# Patient Record
Sex: Female | Born: 1990 | Race: White | Hispanic: Yes | Marital: Single | Smoking: Current every day smoker
Health system: Southern US, Community
[De-identification: ages and names within clinical notes are randomized; demographics above are authoritative.]

---
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
## 2010-01-11 ENCOUNTER — Ambulatory Visit: Payer: Self-pay | Admitting: Diagnostic Radiology

## 2010-01-11 ENCOUNTER — Emergency Department (HOSPITAL_BASED_OUTPATIENT_CLINIC_OR_DEPARTMENT_OTHER): Admission: EM | Admit: 2010-01-11 | Discharge: 2010-01-11 | Payer: Self-pay | Admitting: Emergency Medicine

## 2010-02-21 ENCOUNTER — Inpatient Hospital Stay (HOSPITAL_COMMUNITY): Admission: AD | Admit: 2010-02-21 | Discharge: 2010-02-21 | Payer: Self-pay | Admitting: Obstetrics and Gynecology

## 2010-02-24 ENCOUNTER — Ambulatory Visit (HOSPITAL_COMMUNITY): Admission: RE | Admit: 2010-02-24 | Discharge: 2010-02-24 | Payer: Self-pay | Admitting: Obstetrics & Gynecology

## 2010-04-07 ENCOUNTER — Ambulatory Visit: Payer: Self-pay | Admitting: Family Medicine

## 2010-04-10 ENCOUNTER — Inpatient Hospital Stay (HOSPITAL_COMMUNITY): Admission: AD | Admit: 2010-04-10 | Discharge: 2010-04-10 | Payer: Self-pay | Admitting: Obstetrics & Gynecology

## 2010-04-10 ENCOUNTER — Ambulatory Visit: Payer: Self-pay | Admitting: Obstetrics and Gynecology

## 2010-04-10 ENCOUNTER — Inpatient Hospital Stay (HOSPITAL_COMMUNITY): Admission: AD | Admit: 2010-04-10 | Discharge: 2010-04-13 | Payer: Self-pay | Admitting: Obstetrics & Gynecology

## 2010-09-06 LAB — CBC
HCT: 32.7 % — ABNORMAL LOW (ref 36.0–46.0)
Hemoglobin: 11.1 g/dL — ABNORMAL LOW (ref 12.0–15.0)
MCH: 32 pg (ref 26.0–34.0)
MCV: 94.3 fL (ref 78.0–100.0)
RBC: 3.47 MIL/uL — ABNORMAL LOW (ref 3.87–5.11)
RDW: 14.8 % (ref 11.5–15.5)

## 2010-09-08 LAB — URINALYSIS, ROUTINE W REFLEX MICROSCOPIC
Glucose, UA: NEGATIVE mg/dL
Ketones, ur: NEGATIVE mg/dL
Protein, ur: NEGATIVE mg/dL

## 2010-09-08 LAB — URINE MICROSCOPIC-ADD ON: RBC / HPF: NONE SEEN RBC/hpf (ref <3)

## 2010-09-09 LAB — URINALYSIS, ROUTINE W REFLEX MICROSCOPIC
Bilirubin Urine: NEGATIVE
Ketones, ur: NEGATIVE mg/dL
Nitrite: NEGATIVE
Protein, ur: NEGATIVE mg/dL
Specific Gravity, Urine: 1.008 (ref 1.005–1.030)
pH: 7 (ref 5.0–8.0)

## 2010-09-09 LAB — WET PREP, GENITAL: Yeast Wet Prep HPF POC: NONE SEEN

## 2010-09-09 LAB — URINE MICROSCOPIC-ADD ON

## 2011-03-20 LAB — WOUND CULTURE: Gram Stain: NONE SEEN

## 2011-08-24 IMAGING — US US OB LIMITED
1 series · 14 of 28 positions shown · non-contrast
Comparison: none

CLINICAL DATA: Left lower quadrant pain.  Uncertain menstrual
dates.

[Series 1: us ob limited · 0.30mm/px · 14 of 28 slices shown]
[im 2/28]
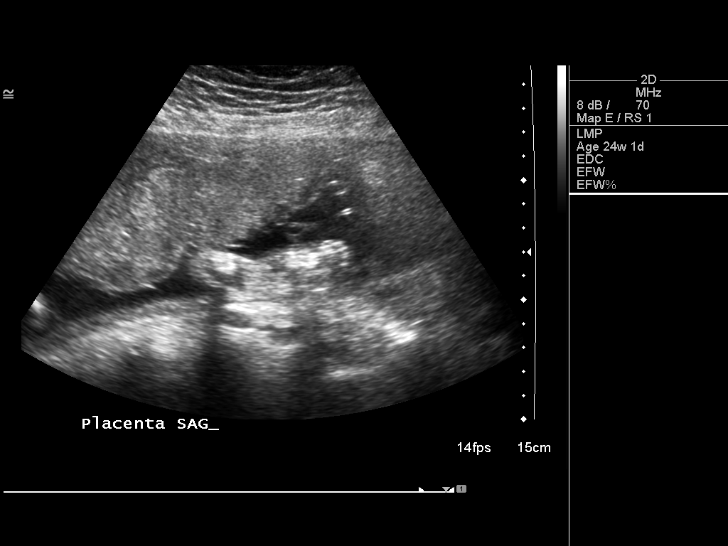
[im 4/28]
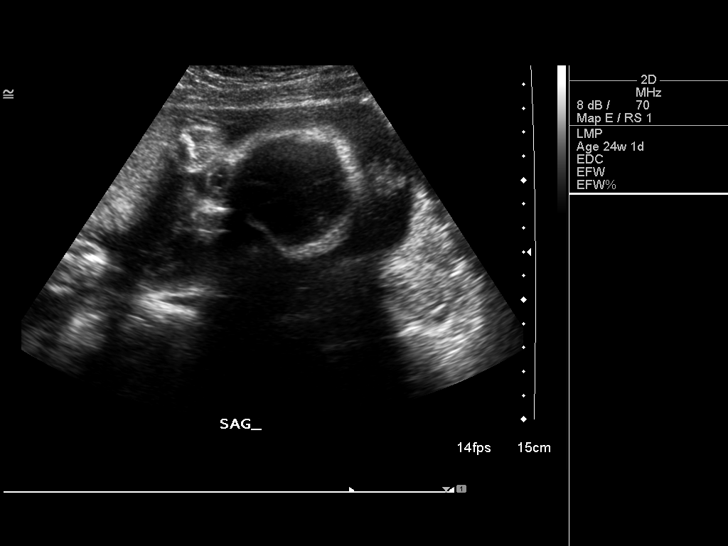
[im 6/28]
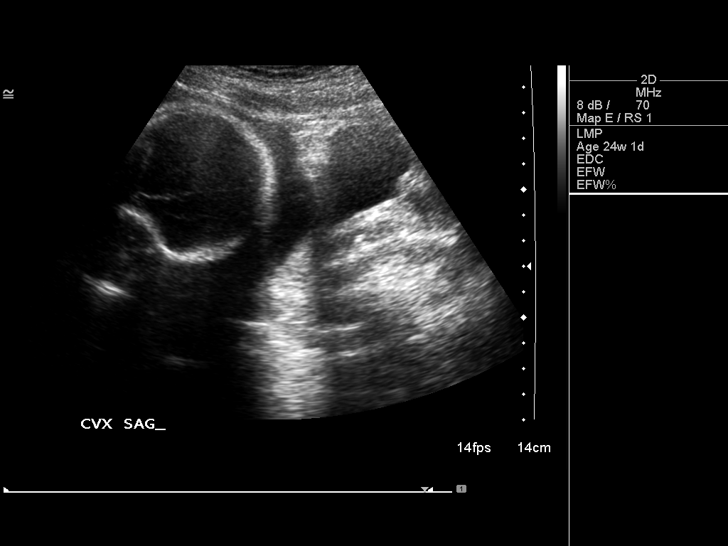
[im 8/28]
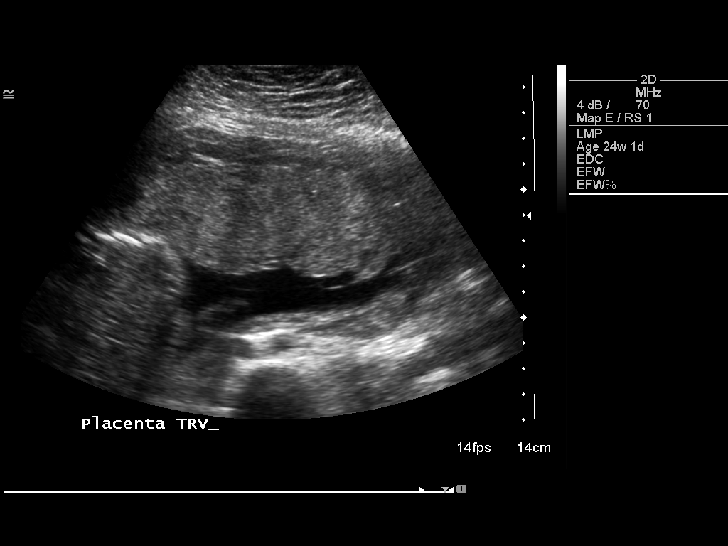
[im 10/28]
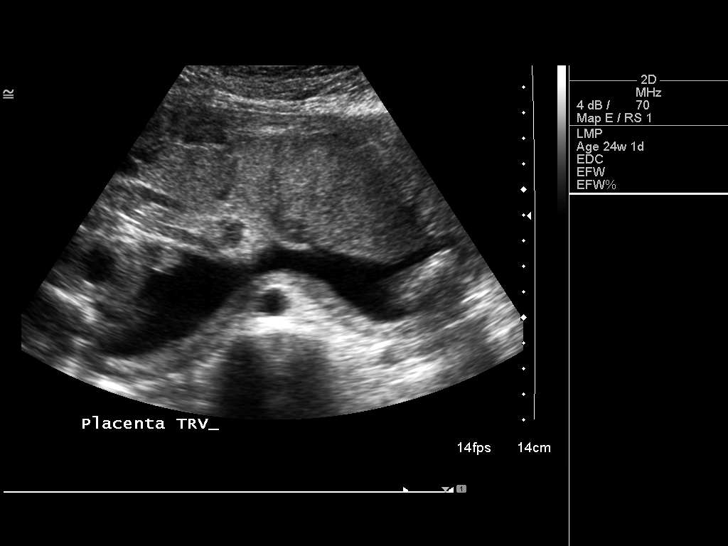
[im 12/28]
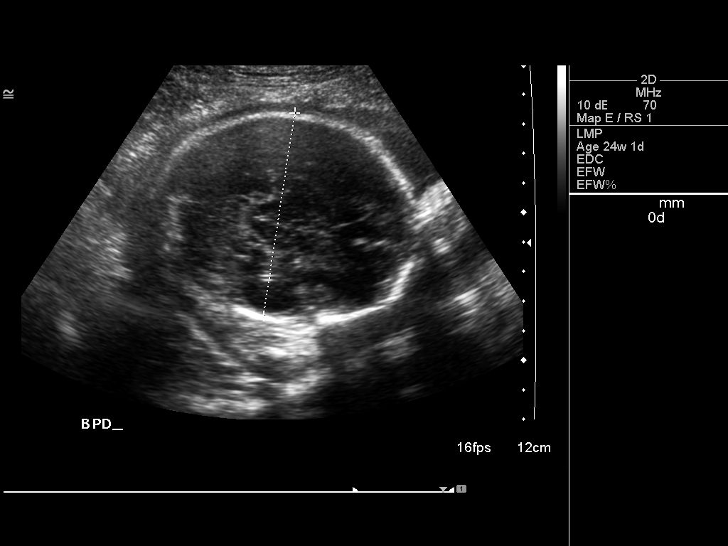
[im 14/28]
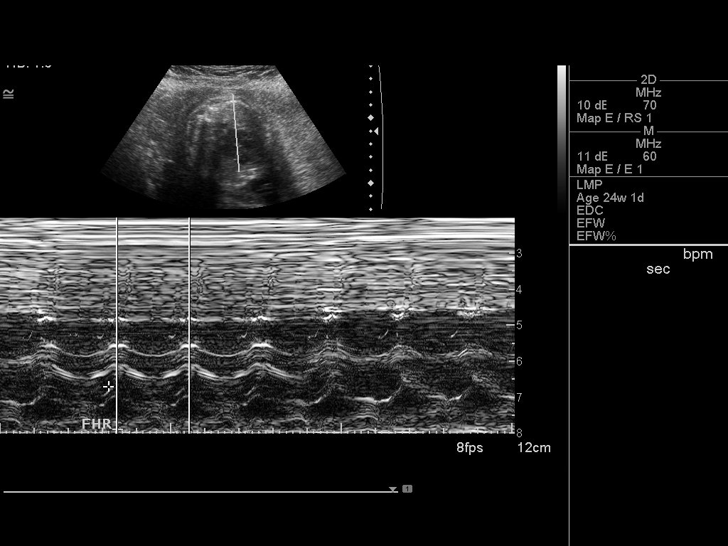
[im 16/28]
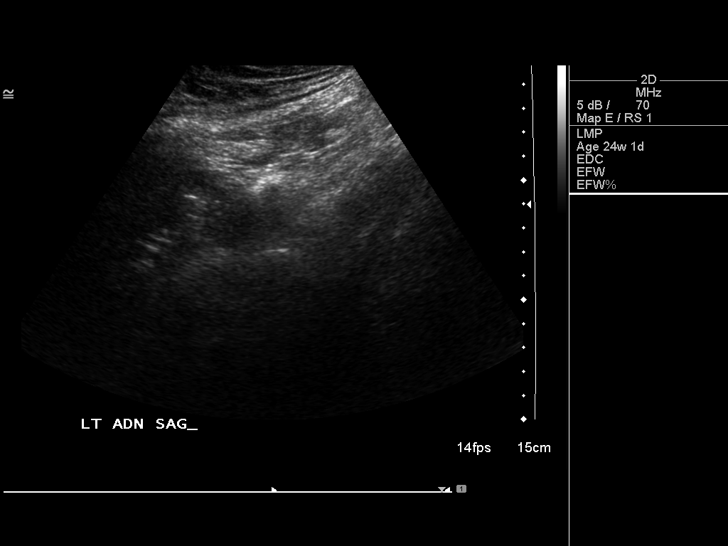
[im 18/28]
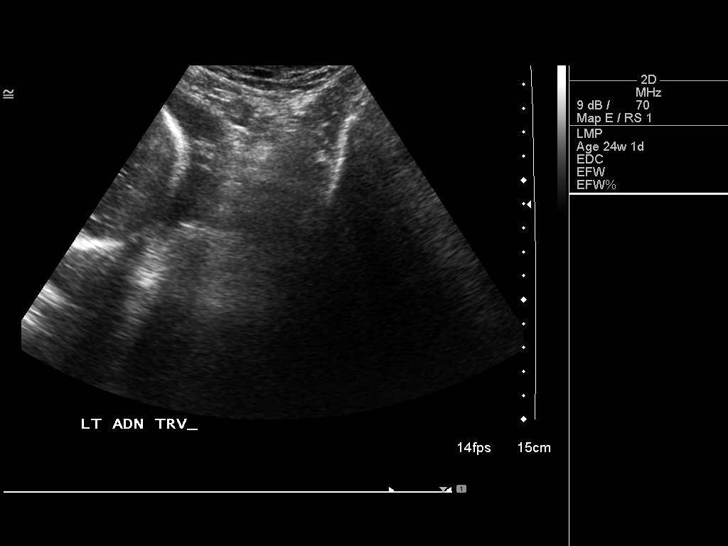
[im 20/28]
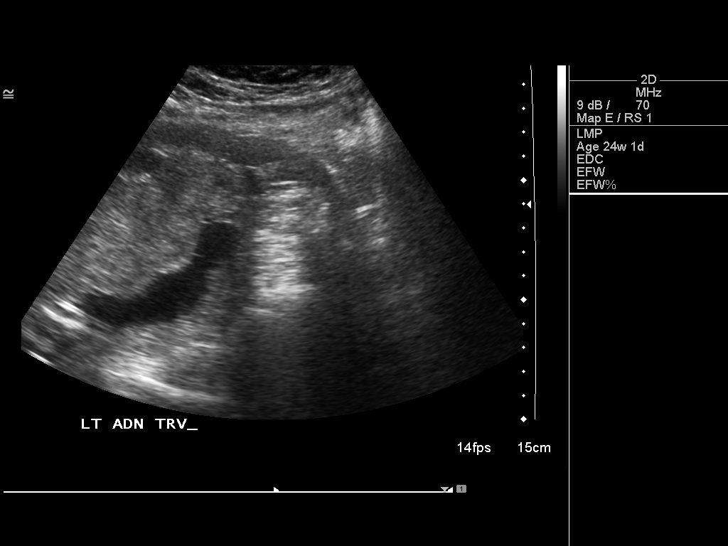
[im 22/28]
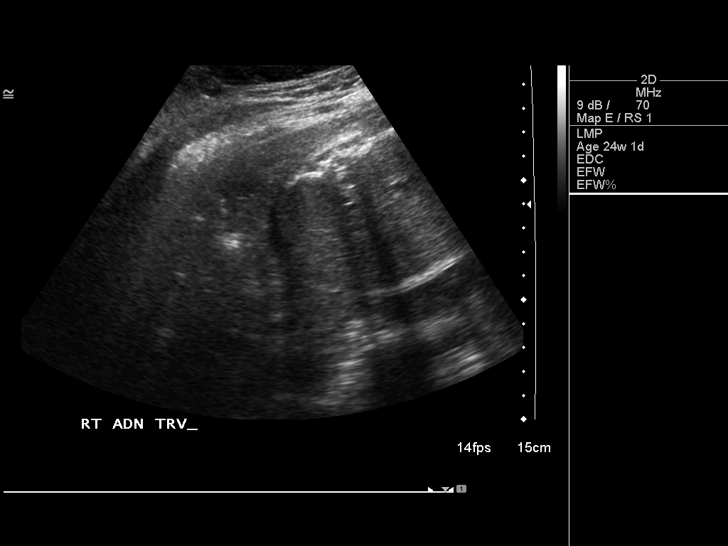
[im 24/28]
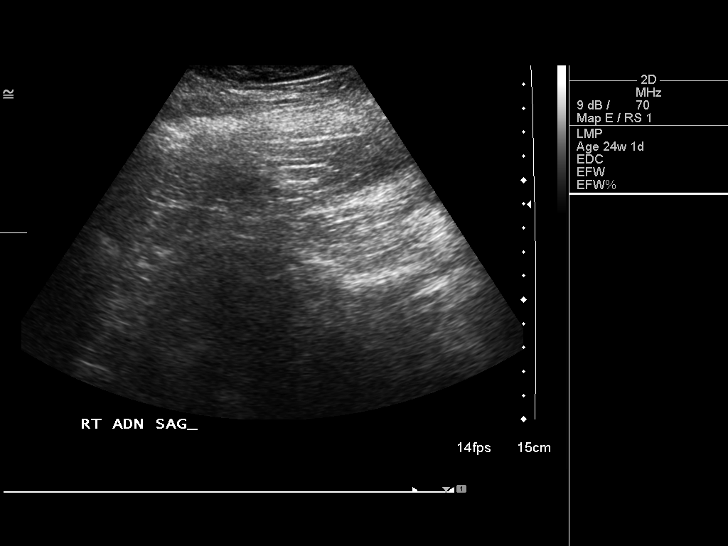
[im 26/28]
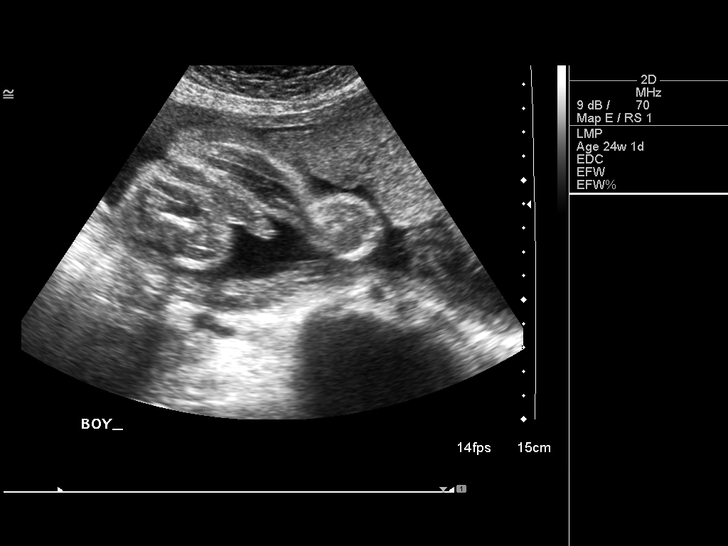
[im 28/28]
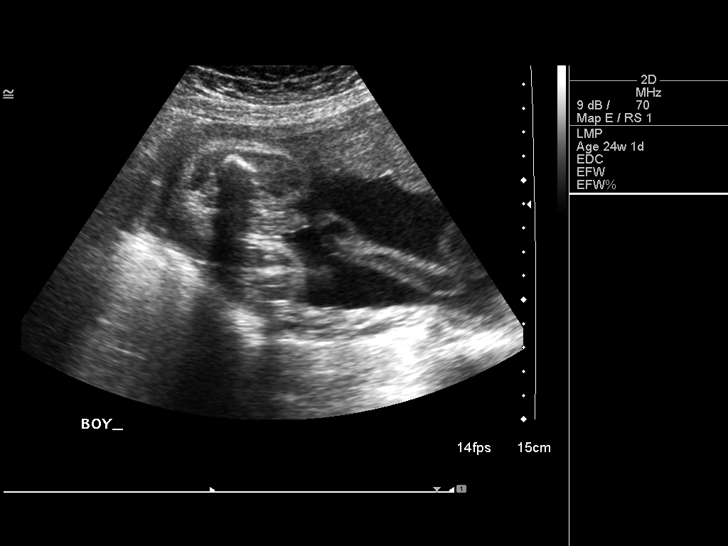

[14 of 28 positions shown; findings below may reference images not displayed]

LIMITED OBSTETRIC ULTRASOUND

Number of Fetuses: 1
Heart Rate: 909bpm
Movement: Visualized
Presentation: Cephalic
Placental Location: Anterior left lateral
Previa: No
Amniotic Fluid (Subjective): Within normal limits

BPD: 6.9cm   28w   0d

MATERNAL FINDINGS:
Cervix: Appears closed./
Uterus/Adnexae: No abnormality visualized
IMPRESSION: Single living intrauterine fetus in cephalic presentation at
approximately gestational age of 28 weeks.

Recommend non-emergent OB 14+ wk complete US examination for
complete fetal biometry and anatomic survey.  This could be
performed at the [REDACTED].

## 2011-10-07 IMAGING — US US OB COMP +14 WK
1 series · 14 of 28 positions shown · non-contrast
Comparison: none

OBSTETRICAL ULTRASOUND:
 This ultrasound exam was performed in the [HOSPITAL] Ultrasound Department.  The OB US report was generated in the AS system, and faxed to the ordering physician.  This report is also available in [HOSPITAL]?s [employer] and in [REDACTED] PACS.

[Series 1: us ob comp +14 wk · 0.21mm/px · 14 of 52 slices shown]
[im 2/52]
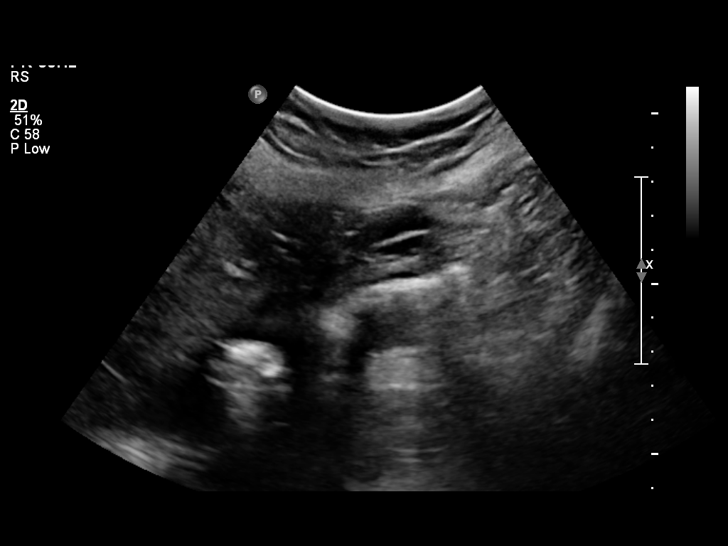
[im 6/52]
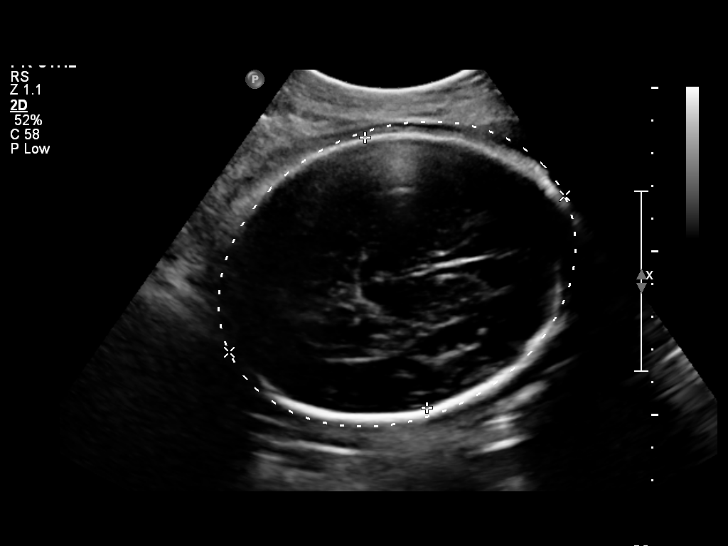
[im 10/52]
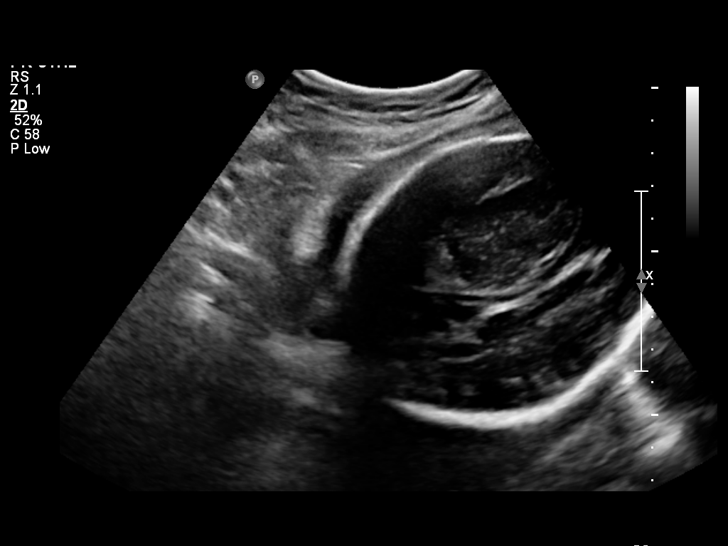
[im 14/52]
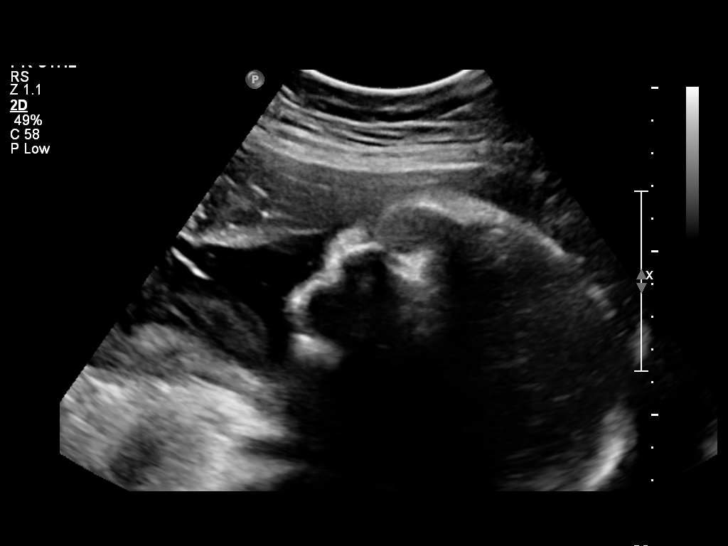
[im 18/52]
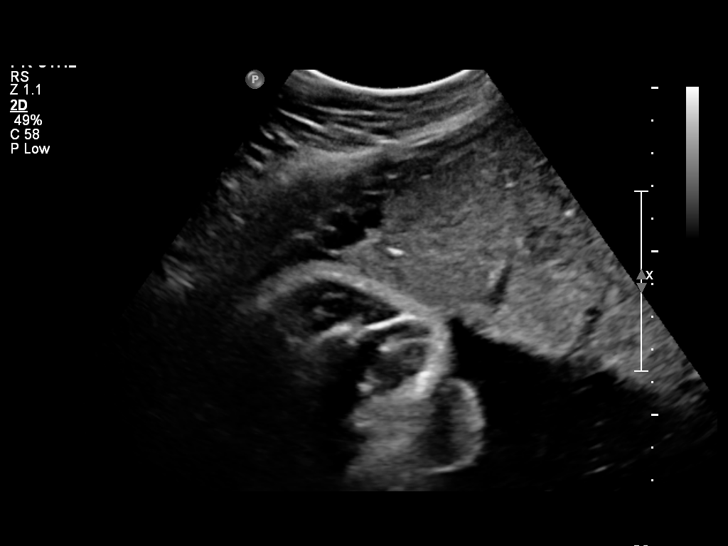
[im 21/52]
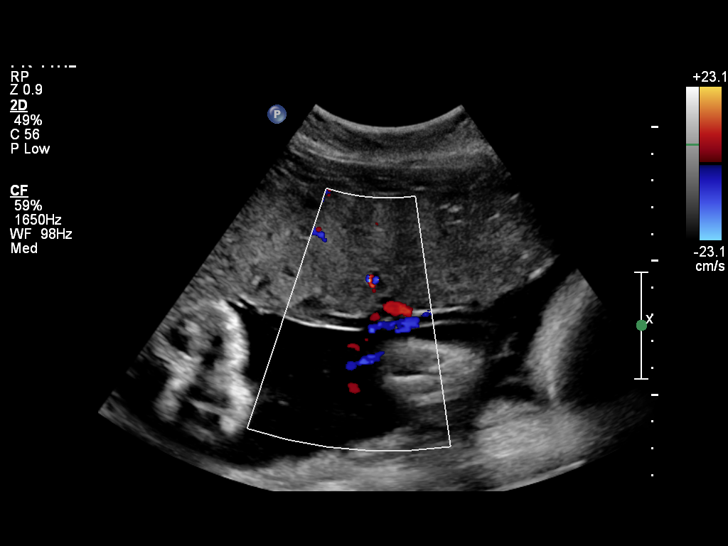
[im 25/52]
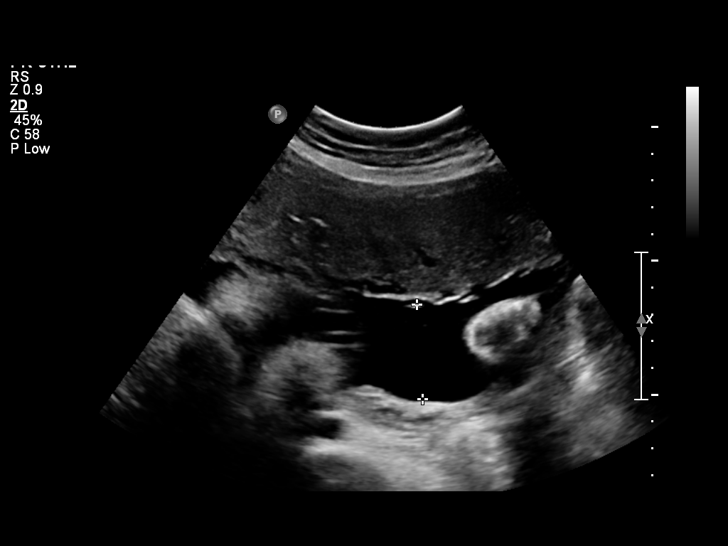
[im 29/52]
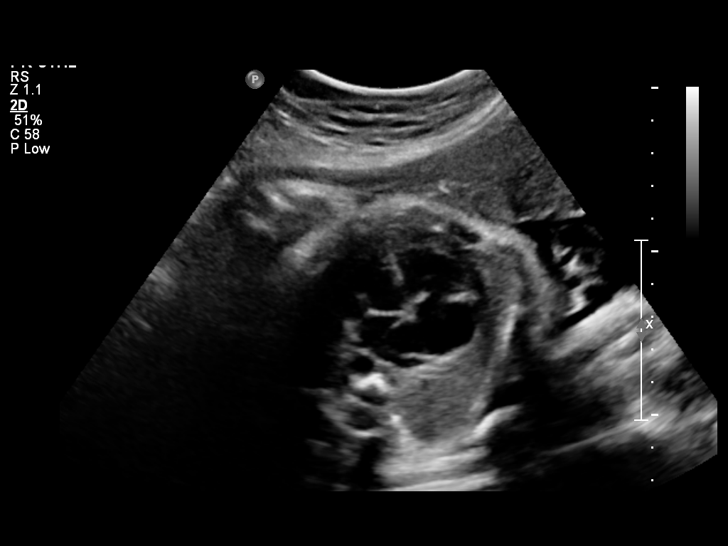
[im 33/52]
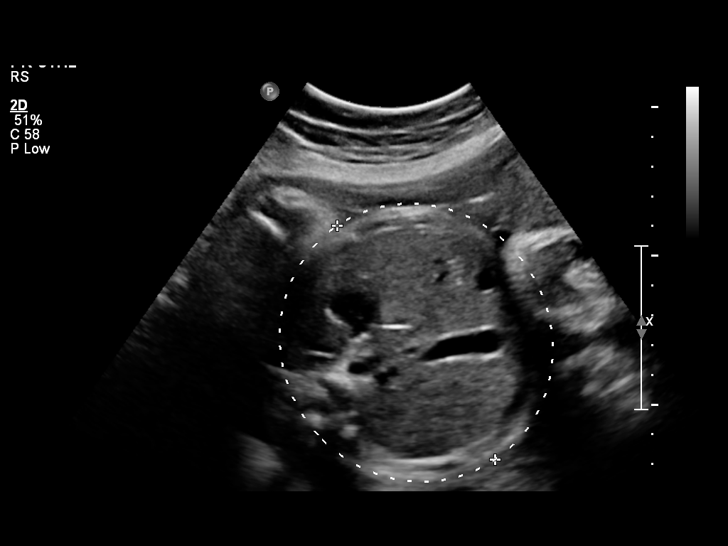
[im 36/52]
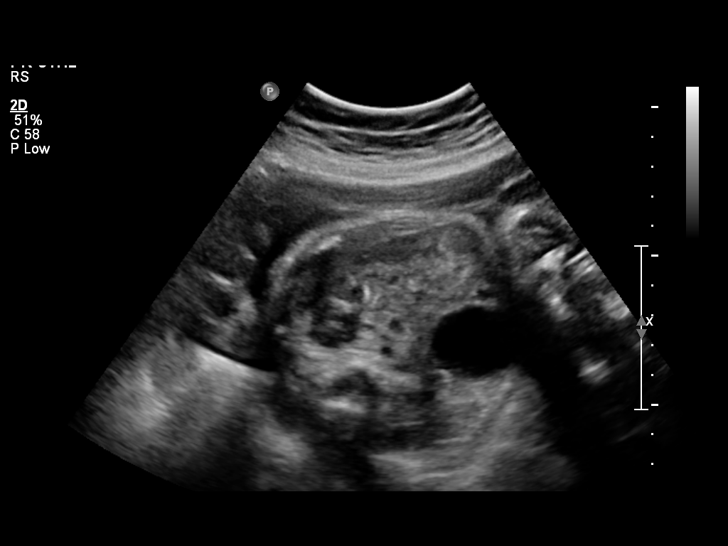
[im 40/52]
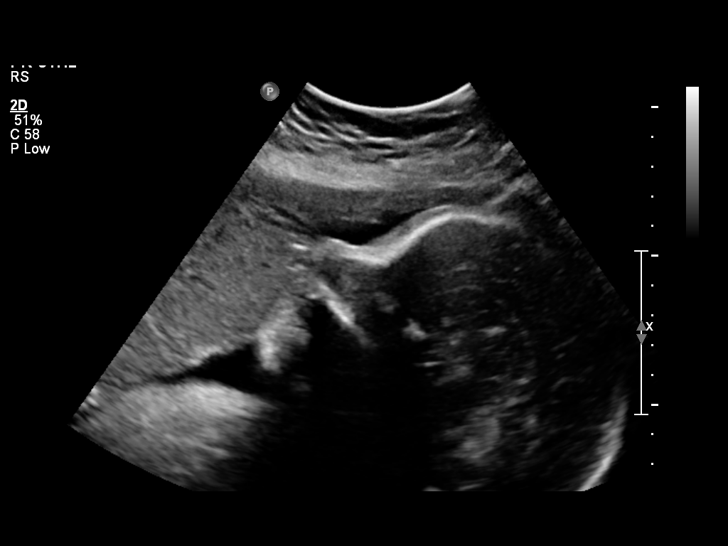
[im 44/52]
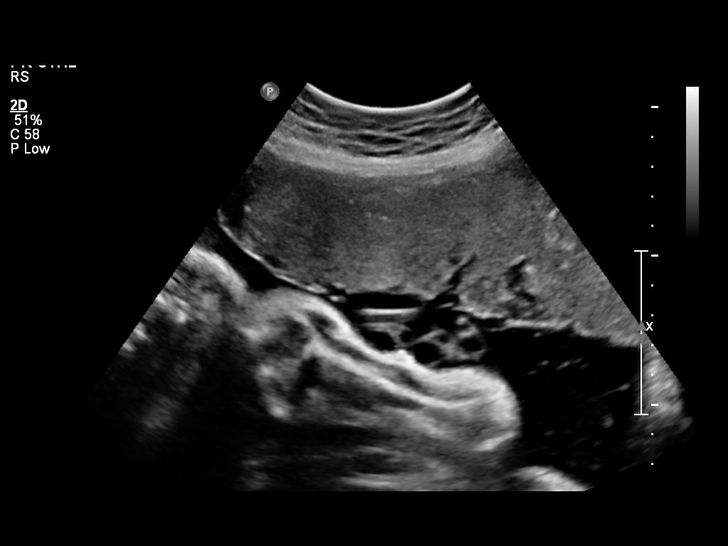
[im 48/52]
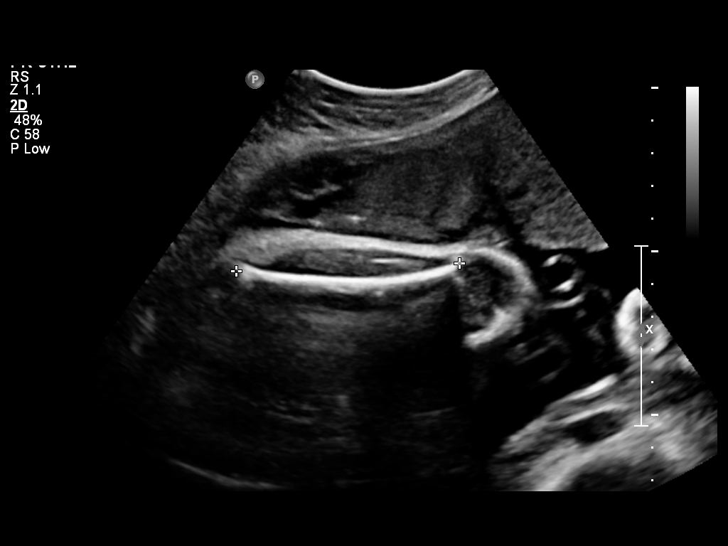
[im 52/52]
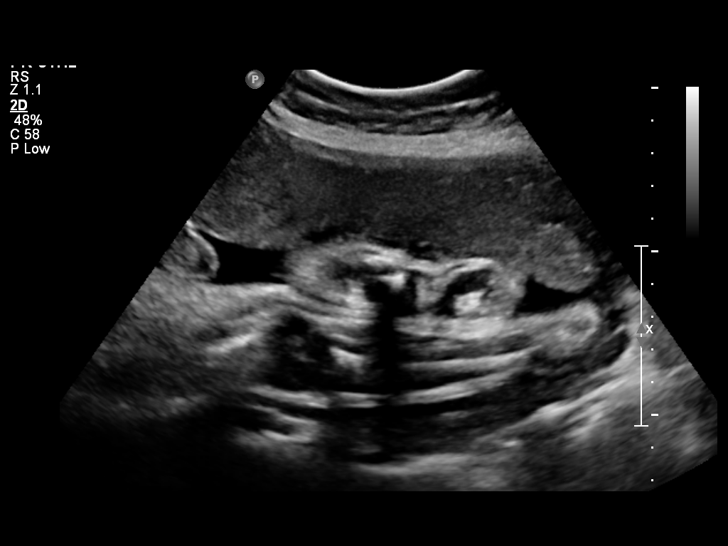

[14 of 28 positions shown; findings below may reference images not displayed]

IMPRESSION: See [facility] Obstetric US report.

## 2013-05-15 DIAGNOSIS — F32A Depression, unspecified: Secondary | ICD-10-CM | POA: Insufficient documentation

## 2013-05-15 DIAGNOSIS — F419 Anxiety disorder, unspecified: Secondary | ICD-10-CM | POA: Insufficient documentation

## 2013-05-15 DIAGNOSIS — E669 Obesity, unspecified: Secondary | ICD-10-CM | POA: Insufficient documentation

## 2013-05-15 DIAGNOSIS — E66811 Obesity, class 1: Secondary | ICD-10-CM | POA: Insufficient documentation

## 2013-05-15 DIAGNOSIS — M545 Low back pain, unspecified: Secondary | ICD-10-CM | POA: Insufficient documentation

## 2013-06-12 DIAGNOSIS — G43909 Migraine, unspecified, not intractable, without status migrainosus: Secondary | ICD-10-CM | POA: Insufficient documentation

## 2014-04-18 ENCOUNTER — Encounter (HOSPITAL_COMMUNITY): Payer: Self-pay | Admitting: Emergency Medicine

## 2014-04-18 ENCOUNTER — Emergency Department (HOSPITAL_COMMUNITY)
Admission: EM | Admit: 2014-04-18 | Discharge: 2014-04-18 | Disposition: A | Discharge status: Home / Self Care | Payer: Medicaid Other | Attending: Emergency Medicine | Admitting: Emergency Medicine

## 2014-04-18 DIAGNOSIS — R531 Weakness: Secondary | ICD-10-CM | POA: Insufficient documentation

## 2014-04-18 DIAGNOSIS — R202 Paresthesia of skin: Secondary | ICD-10-CM | POA: Diagnosis not present

## 2014-04-18 DIAGNOSIS — R479 Unspecified speech disturbances: Secondary | ICD-10-CM | POA: Insufficient documentation

## 2014-04-18 DIAGNOSIS — R200 Anesthesia of skin: Secondary | ICD-10-CM | POA: Insufficient documentation

## 2014-04-18 DIAGNOSIS — R251 Tremor, unspecified: Secondary | ICD-10-CM | POA: Insufficient documentation

## 2014-04-18 DIAGNOSIS — Z720 Tobacco use: Secondary | ICD-10-CM | POA: Diagnosis not present

## 2014-04-18 NOTE — ED Notes (Signed)
Pt c/o numbness to entire body starting with face upon waking and now all over body

## 2014-04-18 NOTE — ED Provider Notes (Signed)
CSN: [id number]     Arrival date & time 04/18/14  1418 History   First MD Initiated Contact with Patient 04/18/14 1509     Chief Complaint  Patient presents with  . Numbness     (Consider location/radiation/quality/duration/timing/severity/associated sxs/prior Treatment) HPI Comments: Pt comes in with cc of numbness. Pt reports that this AM when she woke up, she noticed right sided lip numbness and twitching. Those symptoms lasted for few minutes, but then the numbness spread to rest of her body, bilateral. Numbness is described as tingling. Pt also had some generalized weakness and shaking, and she had some trouble holding cups. Around 3 pm, symptos started improving, pt now has numbness to her face and bilateral pper extremity - forearm, left worse than right. No recent trauma, no tick exposures. No substance abuse.  The history is provided by the patient.    History reviewed. No pertinent past medical history. History reviewed. No pertinent past surgical history. History reviewed. No pertinent family history. History  Substance Use Topics  . Smoking status: Current Every Day Smoker  . Smokeless tobacco: Not on file  . Alcohol Use: Yes   OB History   Grav Para Term Preterm Abortions TAB SAB Ect Mult Living                 Review of Systems  Constitutional: Positive for activity change. Negative for fever, chills and diaphoresis.  Eyes: Negative for visual disturbance.  Respiratory: Negative for shortness of breath.   Cardiovascular: Negative for chest pain.  Gastrointestinal: Negative for nausea, vomiting and abdominal pain.  Genitourinary: Negative for dysuria.  Musculoskeletal: Negative for arthralgias, myalgias, neck pain and neck stiffness.  Skin: Negative for rash.  Allergic/Immunologic: Negative for immunocompromised state.  Neurological: Positive for tremors, facial asymmetry, speech difficulty, weakness and numbness. Negative for syncope, light-headedness and  headaches.  Hematological: Does not bruise/bleed easily.  All other systems reviewed and are negative.     Allergies  Review of patient's allergies indicates no known allergies.  Home Medications   Prior to Admission medications   Medication Sig Start Date End Date Taking? Authorizing Provider  acetaminophen (TYLENOL) 500 MG tablet Take 1,000 mg by mouth every 6 (six) hours as needed for headache.   Yes Historical Provider, MD   BP 100/54  Pulse 73  Temp(Src) 97.9 F (36.6 C) (Oral)  Resp 18  SpO2 100% Physical Exam  Nursing note and vitals reviewed. Constitutional: She is oriented to person, place, and time. She appears well-developed and well-nourished.  HENT:  Head: Normocephalic and atraumatic.  Eyes: EOM are normal. Pupils are equal, round, and reactive to light.  Neck: Neck supple.  Cardiovascular: Normal rate, regular rhythm and normal heart sounds.   No murmur heard. Pulmonary/Chest: Effort normal. No respiratory distress.  Abdominal: Soft. She exhibits no distension. There is no tenderness. There is no rebound and no guarding.  Neurological: She is alert and oriented to person, place, and time.  Subjective numbness - right face, bilateral forearms, left worse than right, able to discriminate between sharp and dull, upper and lower ext bilaterally 4+ and equal and 2+ patellar reflex.  Skin: Skin is warm and dry.    ED Course  Procedures (including critical care time) Labs Review Labs Reviewed - No data to display  Imaging Review No results found.   EKG Interpretation None      MDM   Final diagnoses:  Numbness and tingling    Pt comes in with cc of  numbness. Hx and exam are not suggestive of a central source, infection, or vascular source. No tick bites, no rash, no hx of similar sx and no fam hx of MS, or previous hx of similar sx. No risk factors for stroke. Sx appear to be resolving. RR is normal.  Pt's sx really dont support an emergent  condition. We will get chem 8. Otherwise, she has been advised to see Neurology if her sx persists, and return to the ER if the sx get worse.  UPDATE: Pt had eloped prior to proper discharge, and followup info was provided.   Derwood KaplanAnkit Rhyanna Sorce, MD 04/19/14 1045

## 2014-04-18 NOTE — ED Notes (Signed)
Upon entering room to draw blood, patient and visitor were not in the room and their belongings were missing as well.  Patient appears to have left without telling anyone.  Dr. Rhunette CroftNanavati notified.

## 2016-03-07 ENCOUNTER — Emergency Department (HOSPITAL_COMMUNITY)
Admission: EM | Admit: 2016-03-07 | Discharge: 2016-03-07 | Disposition: A | Discharge status: Home / Self Care | Payer: Medicaid Other | Attending: Emergency Medicine | Admitting: Emergency Medicine

## 2016-03-07 ENCOUNTER — Emergency Department (HOSPITAL_COMMUNITY): Payer: Medicaid Other

## 2016-03-07 ENCOUNTER — Encounter (HOSPITAL_COMMUNITY): Payer: Self-pay | Admitting: Emergency Medicine

## 2016-03-07 DIAGNOSIS — F17210 Nicotine dependence, cigarettes, uncomplicated: Secondary | ICD-10-CM | POA: Diagnosis not present

## 2016-03-07 DIAGNOSIS — R05 Cough: Secondary | ICD-10-CM

## 2016-03-07 DIAGNOSIS — R071 Chest pain on breathing: Secondary | ICD-10-CM | POA: Diagnosis not present

## 2016-03-07 DIAGNOSIS — R0602 Shortness of breath: Secondary | ICD-10-CM | POA: Diagnosis not present

## 2016-03-07 DIAGNOSIS — R059 Cough, unspecified: Secondary | ICD-10-CM

## 2016-03-07 DIAGNOSIS — J029 Acute pharyngitis, unspecified: Secondary | ICD-10-CM

## 2016-03-07 LAB — CBC WITH DIFFERENTIAL/PLATELET
BASOS ABS: 0 10*3/uL (ref 0.0–0.1)
Basophils Relative: 0 %
Eosinophils Absolute: 0.2 10*3/uL (ref 0.0–0.7)
Eosinophils Relative: 2 %
HEMATOCRIT: 41.9 % (ref 36.0–46.0)
HEMOGLOBIN: 13.6 g/dL (ref 12.0–15.0)
LYMPHS PCT: 14 %
Lymphs Abs: 1.6 10*3/uL (ref 0.7–4.0)
MCH: 30.2 pg (ref 26.0–34.0)
MCHC: 32.5 g/dL (ref 30.0–36.0)
MCV: 92.9 fL (ref 78.0–100.0)
MONO ABS: 0.5 10*3/uL (ref 0.1–1.0)
Monocytes Relative: 4 %
NEUTROS ABS: 8.7 10*3/uL — AB (ref 1.7–7.7)
Neutrophils Relative %: 80 %
Platelets: 248 10*3/uL (ref 150–400)
RBC: 4.51 MIL/uL (ref 3.87–5.11)
RDW: 14.9 % (ref 11.5–15.5)
WBC: 11 10*3/uL — AB (ref 4.0–10.5)

## 2016-03-07 LAB — I-STAT TROPONIN, ED: Troponin i, poc: 0 ng/mL (ref 0.00–0.08)

## 2016-03-07 LAB — BASIC METABOLIC PANEL
ANION GAP: 9 (ref 5–15)
BUN: 7 mg/dL (ref 6–20)
CALCIUM: 9.3 mg/dL (ref 8.9–10.3)
CHLORIDE: 107 mmol/L (ref 101–111)
CO2: 24 mmol/L (ref 22–32)
Creatinine, Ser: 0.6 mg/dL (ref 0.44–1.00)
GFR calc non Af Amer: >60 mL/min (ref >60)
Glucose, Bld: 101 mg/dL — ABNORMAL HIGH (ref 65–99)
Potassium: 3.9 mmol/L (ref 3.5–5.1)
Sodium: 140 mmol/L (ref 135–145)

## 2016-03-07 LAB — POC URINE PREG, ED: Preg Test, Ur: NEGATIVE

## 2016-03-07 LAB — D-DIMER, QUANTITATIVE (NOT AT ARMC): D DIMER QUANT: 0.32 ug{FEU}/mL (ref 0.00–0.50)

## 2016-03-07 MED ORDER — BENZONATATE 100 MG PO CAPS: 100.0000 mg | ORAL_CAPSULE | Freq: Three times a day (TID) | ORAL | 0 refills | Status: AC | PRN

## 2016-03-07 MED ORDER — IPRATROPIUM-ALBUTEROL 0.5-2.5 (3) MG/3ML IN SOLN
3.0000 mL | Freq: Once | RESPIRATORY_TRACT | Status: AC
  Administered 2016-03-07: 3 mL via RESPIRATORY_TRACT
  Filled 2016-03-07: qty 3

## 2016-03-07 NOTE — ED Notes (Signed)
Dr. Little MD at bedside. 

## 2016-03-07 NOTE — ED Triage Notes (Signed)
Pt arrives from home reporting productive cough starting yesterday, with new SOB and CP starting during the night. Pt denies fever, chills, N/V.  Pt reports being current 1 pack/day smoker.  Resp e/u.

## 2016-03-07 NOTE — ED Provider Notes (Signed)
[facility]-EMERGENCY DEPT Provider Note   CSN: [id number] Arrival date & time: 03/07/16  0840     History   Chief Complaint Chief Complaint  Patient presents with  . Cough  . Shortness of Breath  . Chest Pain    HPI Allison Cisneros is a 25 y.o. female.  25 year old female who presents with cough, chest pain, and shortness of breath. The patient states that yesterday she began having a cough productive of mucus. The cough has worsened since it began and last night she began having chest pain while coughing. Overnight she has noticed some shortness of breath. The shortness of breath is worse with coughing and she thinks may be worse laying flat. She has felt hot a few times but denies any fevers or chills. No vomiting or diarrhea. She does report associated sore throat and runny nose. No sick contacts or recent travel. No leg swelling, history of blood clots, or OCP use. She does smoke 1 pack per day. No history of asthma.   The history is provided by the patient.  Cough  Associated symptoms include chest pain and shortness of breath.  Shortness of Breath  Associated symptoms include cough and chest pain.  Chest Pain   Associated symptoms include cough and shortness of breath.    History reviewed. No pertinent past medical history.  There are no active problems to display for this patient.   History reviewed. No pertinent surgical history.  OB History    No data available       Home Medications    Prior to Admission medications   Medication Sig Start Date End Date Taking? Authorizing Provider  benzonatate (TESSALON) 100 MG capsule Take 1 capsule (100 mg total) by mouth 3 (three) times daily as needed for cough. 03/07/16   Laurence Spates, MD    Family History No family history on file.  Social History Social History  Substance Use Topics  . Smoking status: Current Every Day Smoker    Packs/day: 1.00    Types: Cigarettes  . Smokeless tobacco: Never Used  .  Alcohol use Yes     Comment: "two shots every once in a while"     Allergies   Review of patient's allergies indicates no known allergies.   Review of Systems Review of Systems  Respiratory: Positive for cough and shortness of breath.   Cardiovascular: Positive for chest pain.   10 Systems reviewed and are negative for acute change except as noted in the HPI.   Physical Exam Updated Vital Signs BP 123/70   Pulse 102   Temp 98.9 F (37.2 C) (Oral) Comment: Simultaneous filing. User may not have seen previous data. Comment (Src): Simultaneous filing. User may not have seen previous data.  Resp 24   Ht 5\' 2"  (1.575 m)   Wt 150 lb (68 kg)   LMP 02/22/2016 (Exact Date)   SpO2 100%   BMI 27.44 kg/m   Physical Exam  Constitutional: She is oriented to person, place, and time. She appears well-developed and well-nourished. No distress.  HENT:  Head: Normocephalic and atraumatic.  Moist mucous membranes  Eyes: Conjunctivae are normal. Pupils are equal, round, and reactive to light.  Neck: Neck supple.  Cardiovascular: Regular rhythm and normal heart sounds.  Tachycardia present.   No murmur heard. Pulmonary/Chest: Effort normal. No stridor. She has no wheezes.  Mild diminished breath sounds bilateral bases, occasional cough  Abdominal: Soft. Bowel sounds are normal. She exhibits no distension. There is  no tenderness.  Musculoskeletal: She exhibits no edema or tenderness.  Lymphadenopathy:    She has no cervical adenopathy.  Neurological: She is alert and oriented to person, place, and time.  Fluent speech, normal gait  Skin: Skin is warm and dry.  Psychiatric: She has a normal mood and affect. Judgment normal.  Nursing note and vitals reviewed.    ED Treatments / Results  Labs (all labs ordered are listed, but only abnormal results are displayed) Labs Reviewed  BASIC METABOLIC PANEL - Abnormal; Notable for the following:       Result Value   Glucose, Bld 101 (*)     All other components within normal limits  CBC WITH DIFFERENTIAL/PLATELET - Abnormal; Notable for the following:    WBC 11.0 (*)    Neutro Abs 8.7 (*)    All other components within normal limits  D-DIMER, QUANTITATIVE (NOT AT [facility])  POC URINE PREG, ED  Rosezena Sensor, ED    EKG  EKG Interpretation  Date/Time:  Wednesday March 07 2016 09:09:42 EDT Ventricular Rate:  94 PR Interval:    QRS Duration: 80 QT Interval:  342 QTC Calculation: 428 R Axis:   40 Text Interpretation:  Sinus rhythm Borderline short PR interval No previous ECGs available Confirmed by Gaylia Kassel MD, Raihan Kimmel [phone]) on 03/07/2016 9:53:14 AM       Radiology Dg Chest 2 View  Result Date: 03/07/2016 CLINICAL DATA:  25 year old with productive cough, chest pain and shortness of breath for 2 days. EXAM: CHEST  2 VIEW COMPARISON:  None. FINDINGS: The heart size and mediastinal contours are normal. The lungs are clear. There is no pleural effusion or pneumothorax. No acute osseous findings are identified. IMPRESSION: No active cardiopulmonary process. Electronically Signed   By: Carey Bullocks M.D.   On: 03/07/2016 09:29    Procedures Procedures (including critical care time)  Medications Ordered in ED Medications  ipratropium-albuterol (DUONEB) 0.5-2.5 (3) MG/3ML nebulizer solution 3 mL (3 mLs Nebulization Given 03/07/16 1004)     Initial Impression / Assessment and Plan / ED Course  I have reviewed the triage vital signs and the nursing notes.  Pertinent labs & imaging results that were available during my care of the patient were reviewed by me and considered in my medical decision making (see chart for details).  Clinical Course   Patient with productive cough, sore throat, and runny nose that began yesterday and chest pain with cough as well as some shortness of breath that began overnight. She was well-appearing on exam. Vital signs notable for mild tachycardia, O2 100% on room air. Normal work of  breathing. No wheezing on exam. Gave the patient a DuoNeb to see if she had any symptom relief. Obtained chest x-ray which was negative.  Patient had no relief after breathing treatment and no wheezing on repeat exam. She continued to complain of chest pain with inspiration and coughing, therefore obtained basic lab work including troponin and d-dimer. All labwork was unremarkable. She has no risk factors for PE or ACS therefore I feel her symptoms are likely related to a viral illness as she has associated nausea and runny nose and sore throat. I discussed supportive care and provided her with cough medication. Patient is a heavy smoker and I have counseled patient on smoking cessation. Return precautions reviewed and patient discharged in satisfactory condition.  Final Clinical Impressions(s) / ED Diagnoses   Final diagnoses:  Cough  Shortness of breath  Sore throat    New Prescriptions  New Prescriptions   BENZONATATE (TESSALON) 100 MG CAPSULE    Take 1 capsule (100 mg total) by mouth 3 (three) times daily as needed for cough.     Laurence Spatesachel Morgan Tynslee Bowlds, MD 03/07/16 1213

## 2017-11-11 DIAGNOSIS — M519 Unspecified thoracic, thoracolumbar and lumbosacral intervertebral disc disorder: Secondary | ICD-10-CM | POA: Insufficient documentation

## 2020-03-28 ENCOUNTER — Other Ambulatory Visit: Payer: Self-pay

## 2020-03-28 ENCOUNTER — Emergency Department (HOSPITAL_COMMUNITY): Payer: Medicaid Other

## 2020-03-28 ENCOUNTER — Encounter (HOSPITAL_COMMUNITY): Payer: Self-pay | Admitting: *Deleted

## 2020-03-28 ENCOUNTER — Emergency Department (HOSPITAL_COMMUNITY)
Admission: EM | Admit: 2020-03-28 | Discharge: 2020-03-28 | Disposition: A | Discharge status: Home / Self Care | Payer: Medicaid Other | Attending: Emergency Medicine | Admitting: Emergency Medicine

## 2020-03-28 DIAGNOSIS — Z5321 Procedure and treatment not carried out due to patient leaving prior to being seen by health care provider: Secondary | ICD-10-CM | POA: Insufficient documentation

## 2020-03-28 DIAGNOSIS — M79641 Pain in right hand: Secondary | ICD-10-CM | POA: Diagnosis present

## 2020-03-28 DIAGNOSIS — W228XXA Striking against or struck by other objects, initial encounter: Secondary | ICD-10-CM | POA: Insufficient documentation

## 2020-03-28 DIAGNOSIS — F17210 Nicotine dependence, cigarettes, uncomplicated: Secondary | ICD-10-CM | POA: Insufficient documentation

## 2020-03-28 DIAGNOSIS — M79642 Pain in left hand: Secondary | ICD-10-CM | POA: Insufficient documentation

## 2020-03-28 DIAGNOSIS — S62306A Unspecified fracture of fifth metacarpal bone, right hand, initial encounter for closed fracture: Secondary | ICD-10-CM | POA: Insufficient documentation

## 2020-03-28 DIAGNOSIS — Z23 Encounter for immunization: Secondary | ICD-10-CM | POA: Diagnosis not present

## 2020-03-28 NOTE — ED Triage Notes (Signed)
Pt reports hitting a metal door about an hour prior to arrival.  Pt's hands are swelling in triage.

## 2020-03-28 NOTE — ED Notes (Signed)
I called patient back to recheck vitals no one responded

## 2020-03-28 NOTE — ED Notes (Signed)
Call for a room x 2, no answer.

## 2020-03-28 NOTE — ED Notes (Signed)
Call for a room x 1. No answer

## 2020-03-28 NOTE — ED Notes (Signed)
Call for a room with no answer x 3.

## 2020-03-29 ENCOUNTER — Encounter (HOSPITAL_COMMUNITY): Payer: Self-pay | Admitting: Emergency Medicine

## 2020-03-29 ENCOUNTER — Other Ambulatory Visit: Payer: Self-pay

## 2020-03-29 ENCOUNTER — Emergency Department (HOSPITAL_COMMUNITY)
Admission: EM | Admit: 2020-03-29 | Discharge: 2020-03-29 | Disposition: A | Discharge status: Home / Self Care | Payer: Medicaid Other | Source: Home / Self Care | Attending: Emergency Medicine | Admitting: Emergency Medicine

## 2020-03-29 DIAGNOSIS — S62339A Displaced fracture of neck of unspecified metacarpal bone, initial encounter for closed fracture: Secondary | ICD-10-CM

## 2020-03-29 MED ORDER — HYDROCODONE-ACETAMINOPHEN 5-325 MG PO TABS: 1.0000 | ORAL_TABLET | Freq: Four times a day (QID) | ORAL | 0 refills | Status: DC | PRN

## 2020-03-29 MED ORDER — TETANUS-DIPHTH-ACELL PERTUSSIS 5-2.5-18.5 LF-MCG/0.5 IM SUSP
0.5000 mL | Freq: Once | INTRAMUSCULAR | Status: AC
  Administered 2020-03-29: 0.5 mL via INTRAMUSCULAR
  Filled 2020-03-29: qty 0.5

## 2020-03-29 MED ORDER — OXYCODONE-ACETAMINOPHEN 5-325 MG PO TABS
1.0000 | ORAL_TABLET | Freq: Once | ORAL | Status: AC
Start: 2020-03-29 — End: 2020-03-29
  Administered 2020-03-29: 1 via ORAL
  Filled 2020-03-29: qty 1

## 2020-03-29 NOTE — ED Provider Notes (Signed)
[facility] EMERGENCY DEPARTMENT Provider Note   CSN: [id number] Arrival date & time: 03/28/20  2310     History Chief Complaint  Patient presents with  . Hand Pain    Allison Cisneros is a 29 y.o. female.  HPI     This is a 29 year old female who presents with right hand pain.  She is right-handed.  Patient reports that she punched a metal door around noon yesterday.  She is reporting 9 out of 10 pain.  She denies any other injury.  She denies weakness, numbness, tingling of the fingers.  She has not taken anything for her pain.  History reviewed. No pertinent past medical history.  There are no problems to display for this patient.   History reviewed. No pertinent surgical history.   OB History   No obstetric history on file.     No family history on file.  Social History   Tobacco Use  . Smoking status: Current Every Day Smoker    Packs/day: 1.00    Types: Cigarettes  . Smokeless tobacco: Never Used  Vaping Use  . Vaping Use: Never used  Substance Use Topics  . Alcohol use: Yes    Comment: "two shots every once in a while"  . Drug use: Yes    Types: Marijuana    Home Medications Prior to Admission medications   Medication Sig Start Date End Date Taking? Authorizing Provider  benzonatate (TESSALON) 100 MG capsule Take 1 capsule (100 mg total) by mouth 3 (three) times daily as needed for cough. 03/07/16   Little, Ambrose Finland, MD    Allergies    Patient has no known allergies.  Review of Systems   Review of Systems  Musculoskeletal:       Hand pain  Neurological: Negative for weakness and numbness.  All other systems reviewed and are negative.   Physical Exam Updated Vital Signs BP 129/76 (BP Location: Left Arm)   Pulse 82   Temp 98.4 F (36.9 C) (Oral)   Resp 17   LMP 03/21/2020   SpO2 98%   Physical Exam Vitals and nursing note reviewed.  Constitutional:      Appearance: She is well-developed. She is not ill-appearing.  HENT:      Head: Normocephalic and atraumatic.     Mouth/Throat:     Mouth: Mucous membranes are moist.  Eyes:     Pupils: Pupils are equal, round, and reactive to light.  Cardiovascular:     Rate and Rhythm: Normal rate and regular rhythm.  Pulmonary:     Effort: Pulmonary effort is normal. No respiratory distress.  Musculoskeletal:     Cervical back: Neck supple.     Comments: Tenderness to palpation with swelling noted over the dorsum of the hand at the fifth metacarpal, flexion and extension of all 5 digits intact, there is a punctate less than 0.5 cm abrasion between the web spacing of the fourth and fifth digits which has scabbed over, 2+ radial pulse  Skin:    General: Skin is warm and dry.  Neurological:     Mental Status: She is alert and oriented to person, place, and time.  Psychiatric:        Mood and Affect: Mood normal.     ED Results / Procedures / Treatments   Labs (all labs ordered are listed, but only abnormal results are displayed) Labs Reviewed - No data to display  EKG None  Radiology DG Hand Complete Left  Result Date:  03/28/2020 CLINICAL DATA:  Blunt trauma to the hands. EXAM: LEFT HAND - COMPLETE 3+ VIEW COMPARISON:  None. FINDINGS: No evidence of fracture of the carpal or metacarpal bones. Radiocarpal joint is intact. Phalanges are normal. No soft tissue injury. IMPRESSION: No fracture or dislocation. Electronically Signed   By: Genevive Bi M.D.   On: 03/28/2020 14:12   DG Hand Complete Right  Result Date: 03/28/2020 CLINICAL DATA:  Punched a metal door.  Right hand pain. EXAM: RIGHT HAND - COMPLETE 3+ VIEW COMPARISON:  None. FINDINGS: Comminuted fracture involving the fifth metacarpal neck with mild palmar displacement and angulation. No other fractures are identified.  The joint spaces are maintained. IMPRESSION: Comminuted fifth metacarpal neck fracture. Electronically Signed   By: Rudie Meyer M.D.   On: 03/28/2020 14:14    Procedures Procedures  (including critical care time)  Medications Ordered in ED Medications  Tdap (BOOSTRIX) injection 0.5 mL (has no administration in time range)  oxyCODONE-acetaminophen (PERCOCET/ROXICET) 5-325 MG per tablet 1 tablet (has no administration in time range)    ED Course  I have reviewed the triage vital signs and the nursing notes.  Pertinent labs & imaging results that were available during my care of the patient were reviewed by me and considered in my medical decision making (see chart for details).    MDM Rules/Calculators/A&P                           Patient presents with swelling and pain in the hand after punching a door.  She is overall nontoxic and vital signs are reassuring.  She is suspicious for fracture.  Other considerations include contusion.  X-rays independently reviewed by self from [facility] and are notable for a boxer's fracture.  Patient placed in a ulnar gutter splint.  She does have a punctate abrasion between the fourth and fifth digits; however, this appears superficial and I believe that this fracture would be considered closed.  I did update her tetanus because of the abrasion and the metal door.  Recommend follow-up with Dr. Romeo Apple.  She will be given a short course of pain medication.  After history, exam, and medical workup I feel the patient has been appropriately medically screened and is safe for discharge home. Pertinent diagnoses were discussed with the patient. Patient was given return precautions.   Final Clinical Impression(s) / ED Diagnoses Final diagnoses:  Closed boxer's fracture, initial encounter    Rx / DC Orders ED Discharge Orders    None       Aleks Nawrot, Mayer Masker, MD 03/29/20 [phone]

## 2020-03-29 NOTE — Discharge Instructions (Addendum)
You were seen today noted to have a boxer's fracture.  Keep in splint.  Follow-up with orthopedics.  Make sure to keep elevated and iced for the next 2 to 3 days.

## 2020-03-29 NOTE — ED Triage Notes (Signed)
Pt reports hitting metal door around noon yesterday. Swelling noted to the right hand.

## 2020-09-13 DIAGNOSIS — F411 Generalized anxiety disorder: Secondary | ICD-10-CM | POA: Insufficient documentation

## 2020-12-01 DIAGNOSIS — F1110 Opioid abuse, uncomplicated: Secondary | ICD-10-CM | POA: Insufficient documentation

## 2020-12-19 DIAGNOSIS — F39 Unspecified mood [affective] disorder: Secondary | ICD-10-CM | POA: Insufficient documentation

## 2020-12-19 DIAGNOSIS — F4310 Post-traumatic stress disorder, unspecified: Secondary | ICD-10-CM | POA: Insufficient documentation

## 2020-12-19 DIAGNOSIS — Z599 Problem related to housing and economic circumstances, unspecified: Secondary | ICD-10-CM | POA: Insufficient documentation

## 2020-12-19 DIAGNOSIS — F1920 Other psychoactive substance dependence, uncomplicated: Secondary | ICD-10-CM | POA: Insufficient documentation

## 2021-11-08 IMAGING — CR DG HAND COMPLETE 3+V*R*
3 series · 3 of 3 positions shown · non-contrast
Comparison: None.

CLINICAL DATA: Punched a metal door.  Right hand pain.

EXAM:
RIGHT HAND - COMPLETE 3+ VIEW

[x hand pa right]
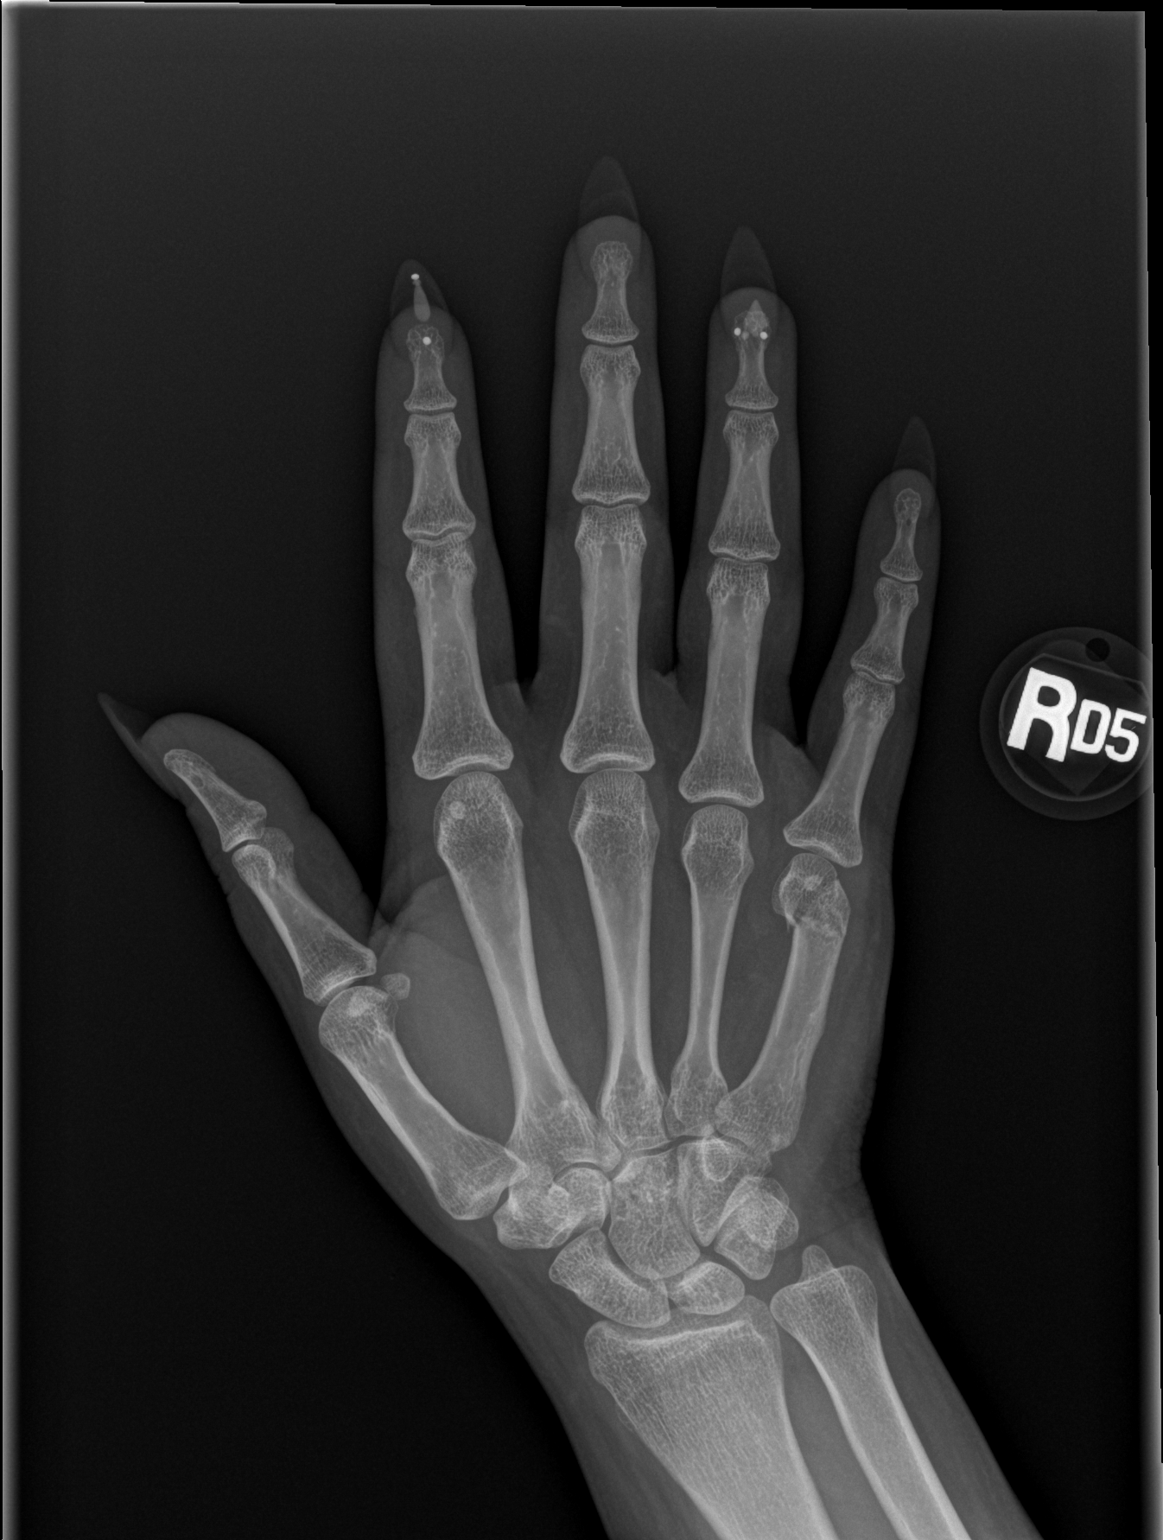

[x hand obl right]
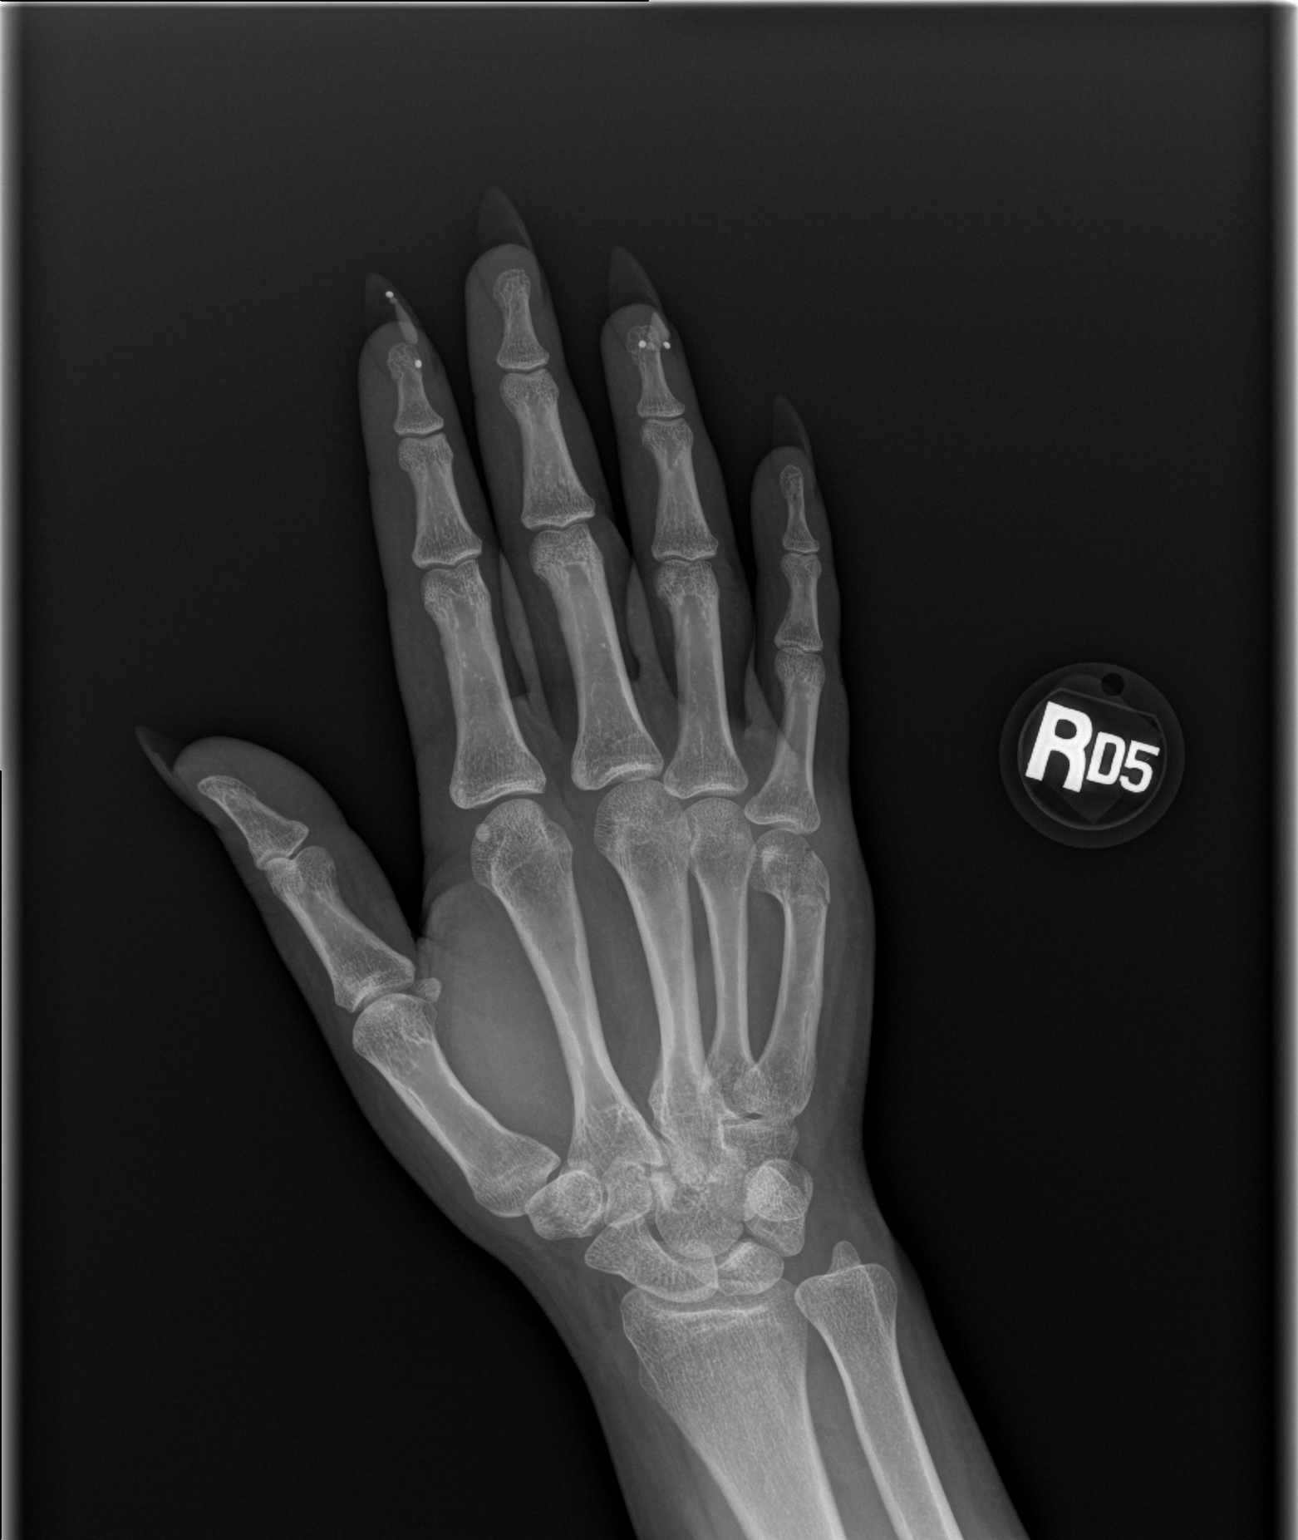

[x hand lat right]
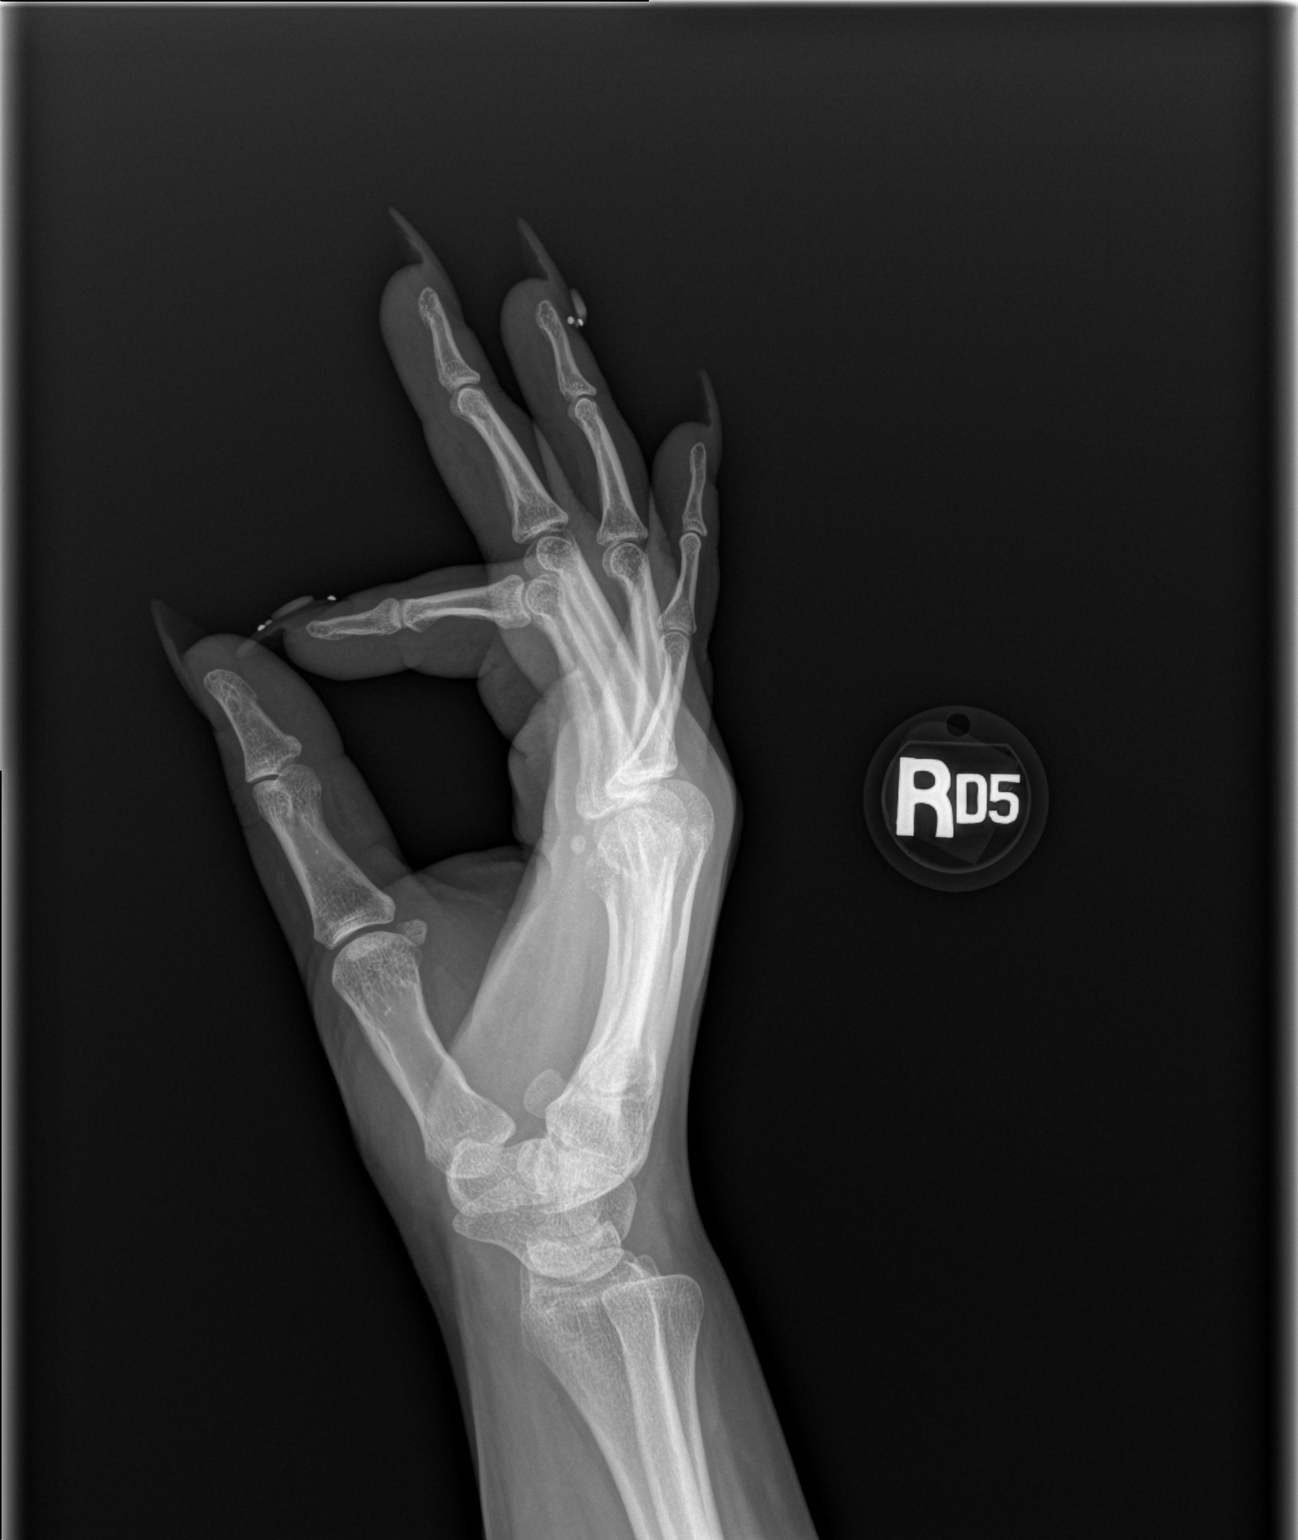

[3 of 3 positions shown; findings below may reference images not displayed]

FINDINGS: Comminuted fracture involving the fifth metacarpal neck with mild
palmar displacement and angulation.

No other fractures are identified.  The joint spaces are maintained.
IMPRESSION: Comminuted fifth metacarpal neck fracture.

## 2021-12-25 ENCOUNTER — Telehealth: Payer: Self-pay | Admitting: General Practice

## 2021-12-25 NOTE — Telephone Encounter (Signed)
[facility] medical records request received and forwarded to HIM on 12/14/2021

## 2022-07-18 ENCOUNTER — Emergency Department (HOSPITAL_COMMUNITY)
Admission: EM | Admit: 2022-07-18 | Discharge: 2022-07-18 | Disposition: A | Discharge status: Home / Self Care | Payer: Medicaid Other | Attending: Emergency Medicine | Admitting: Emergency Medicine

## 2022-07-18 ENCOUNTER — Emergency Department (HOSPITAL_COMMUNITY): Payer: Medicaid Other

## 2022-07-18 DIAGNOSIS — H5712 Ocular pain, left eye: Secondary | ICD-10-CM | POA: Diagnosis present

## 2022-07-18 DIAGNOSIS — S0232XA Fracture of orbital floor, left side, initial encounter for closed fracture: Secondary | ICD-10-CM | POA: Diagnosis not present

## 2022-07-18 LAB — PREGNANCY, URINE: Preg Test, Ur: NEGATIVE

## 2022-07-18 MED ORDER — FLUORESCEIN SODIUM 1 MG OP STRP
1.0000 | ORAL_STRIP | Freq: Once | OPHTHALMIC | Status: AC
  Administered 2022-07-18: 1 via OPHTHALMIC
  Filled 2022-07-18: qty 1

## 2022-07-18 MED ORDER — TETRACAINE HCL 0.5 % OP SOLN
1.0000 [drp] | Freq: Once | OPHTHALMIC | Status: AC
  Administered 2022-07-18: 1 [drp] via OPHTHALMIC
  Filled 2022-07-18: qty 4

## 2022-07-18 MED ORDER — ERYTHROMYCIN 5 MG/GM OP OINT: TOPICAL_OINTMENT | Freq: Four times a day (QID) | OPHTHALMIC | 0 refills | Status: AC

## 2022-07-18 MED ORDER — ACETAMINOPHEN 325 MG PO TABS
650.0000 mg | ORAL_TABLET | Freq: Once | ORAL | Status: AC
  Administered 2022-07-18: 650 mg via ORAL
  Filled 2022-07-18: qty 2

## 2022-07-18 MED ORDER — ERYTHROMYCIN 5 MG/GM OP OINT
TOPICAL_OINTMENT | Freq: Once | OPHTHALMIC | Status: AC
  Filled 2022-07-18: qty 3.5
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
## 2022-07-24 ENCOUNTER — Emergency Department (HOSPITAL_COMMUNITY)
Admission: EM | Admit: 2022-07-24 | Discharge: 2022-07-24 | Discharge status: Against Medical Advice | Payer: Medicaid Other | Attending: Emergency Medicine | Admitting: Emergency Medicine

## 2022-07-24 DIAGNOSIS — R109 Unspecified abdominal pain: Secondary | ICD-10-CM | POA: Diagnosis not present

## 2022-07-24 DIAGNOSIS — R110 Nausea: Secondary | ICD-10-CM | POA: Diagnosis not present

## 2022-07-24 DIAGNOSIS — Z5321 Procedure and treatment not carried out due to patient leaving prior to being seen by health care provider: Secondary | ICD-10-CM | POA: Insufficient documentation

## 2022-07-24 MED ORDER — HYDROCODONE-ACETAMINOPHEN 5-325 MG PO TABS
1.0000 | ORAL_TABLET | Freq: Once | ORAL | Status: AC
  Administered 2022-07-24: 1 via ORAL
  Filled 2022-07-24: qty 1

## 2022-07-24 MED ORDER — ONDANSETRON 4 MG PO TBDP
4.0000 mg | ORAL_TABLET | Freq: Once | ORAL | Status: AC | PRN
  Administered 2022-07-24: 4 mg via ORAL
  Filled 2022-07-24: qty 1
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
## 2022-07-24 NOTE — ED Notes (Addendum)
PT state to writer she is scared of needles and need to lay down. Unable to collect labs in triage due to PT movement

## 2022-07-24 NOTE — ED Notes (Signed)
Pt walked out

## 2022-07-24 NOTE — ED Triage Notes (Signed)
Patient arrived with complaints of central abdominal pain over the last hours. Complaints of nausea.

## 2022-07-24 NOTE — ED Notes (Addendum)
Pt unable to keep arm straight during triage vitals

## 2022-07-24 NOTE — ED Provider Triage Note (Signed)
Emergency Medicine Provider Triage Evaluation Note  Allison Cisneros , a 32 y.o. female  was evaluated in triage.  Pt complains of central abdominal pain that began around an hour ago with associated nausea.  She denies diarrhea, vomiting, fever.  Denies drug or alcohol use.   Review of Systems  Positive: As above Negative: As above  Physical Exam  BP (!) 197/182   Pulse (!) 117   Temp 97.9 F (36.6 C) (Oral)   Resp (!) 23   LMP 07/12/2022 (Approximate)   SpO2 100%  Gen:   Awake, writhing around in wheelchair, moaning and holding her abdomen Resp:  Normal effort  MSK:   Moves extremities without difficulty  Other:    Medical Decision Making  Medically screening exam initiated at 10:43 PM.  Appropriate orders placed.  Allison Cisneros was informed that the remainder of the evaluation will be completed by another provider, this initial triage assessment does not replace that evaluation, and the importance of remaining in the ED until their evaluation is complete.     Theressa Stamps R, [place] 07/24/22 2246

## 2022-07-24 NOTE — ED Notes (Signed)
Pt refused to sign AMA. 

## 2022-07-26 ENCOUNTER — Emergency Department (HOSPITAL_BASED_OUTPATIENT_CLINIC_OR_DEPARTMENT_OTHER): Payer: Medicaid Other

## 2022-07-26 ENCOUNTER — Observation Stay (HOSPITAL_BASED_OUTPATIENT_CLINIC_OR_DEPARTMENT_OTHER)
Admission: EM | Admit: 2022-07-26 | Discharge: 2022-07-27 | Disposition: A | Discharge status: Home / Self Care | Payer: Medicaid Other | Attending: General Surgery | Admitting: General Surgery

## 2022-07-26 ENCOUNTER — Other Ambulatory Visit: Payer: Self-pay

## 2022-07-26 ENCOUNTER — Encounter (HOSPITAL_BASED_OUTPATIENT_CLINIC_OR_DEPARTMENT_OTHER): Payer: Self-pay | Admitting: Emergency Medicine

## 2022-07-26 DIAGNOSIS — Z79899 Other long term (current) drug therapy: Secondary | ICD-10-CM | POA: Insufficient documentation

## 2022-07-26 DIAGNOSIS — R1013 Epigastric pain: Secondary | ICD-10-CM | POA: Diagnosis present

## 2022-07-26 DIAGNOSIS — F17210 Nicotine dependence, cigarettes, uncomplicated: Secondary | ICD-10-CM | POA: Insufficient documentation

## 2022-07-26 DIAGNOSIS — K8012 Calculus of gallbladder with acute and chronic cholecystitis without obstruction: Principal | ICD-10-CM | POA: Insufficient documentation

## 2022-07-26 DIAGNOSIS — K810 Acute cholecystitis: Secondary | ICD-10-CM | POA: Diagnosis present

## 2022-07-26 DIAGNOSIS — K8020 Calculus of gallbladder without cholecystitis without obstruction: Secondary | ICD-10-CM

## 2022-07-26 LAB — RAPID URINE DRUG SCREEN, HOSP PERFORMED
Amphetamines: POSITIVE — AB
Barbiturates: NOT DETECTED
Benzodiazepines: NOT DETECTED
Cocaine: NOT DETECTED
Opiates: NOT DETECTED
Tetrahydrocannabinol: NOT DETECTED

## 2022-07-26 LAB — CBC WITH DIFFERENTIAL/PLATELET
Abs Immature Granulocytes: 0.04 10*3/uL (ref 0.00–0.07)
Basophils Absolute: 0.1 10*3/uL (ref 0.0–0.1)
Basophils Relative: 1 %
Eosinophils Absolute: 0.1 10*3/uL (ref 0.0–0.5)
Eosinophils Relative: 1 %
HCT: 39.9 % (ref 36.0–46.0)
Hemoglobin: 13.6 g/dL (ref 12.0–15.0)
Immature Granulocytes: 1 %
Lymphocytes Relative: 29 %
Lymphs Abs: 2.6 10*3/uL (ref 0.7–4.0)
MCH: 30.6 pg (ref 26.0–34.0)
MCHC: 34.1 g/dL (ref 30.0–36.0)
MCV: 89.9 fL (ref 80.0–100.0)
Monocytes Absolute: 0.7 10*3/uL (ref 0.1–1.0)
Monocytes Relative: 8 %
Neutro Abs: 5.4 10*3/uL (ref 1.7–7.7)
Neutrophils Relative %: 60 %
Platelets: 356 10*3/uL (ref 150–400)
RBC: 4.44 MIL/uL (ref 3.87–5.11)
RDW: 13.3 % (ref 11.5–15.5)
WBC: 8.8 10*3/uL (ref 4.0–10.5)
nRBC: 0 % (ref 0.0–0.2)

## 2022-07-26 LAB — URINALYSIS, MICROSCOPIC (REFLEX): WBC, UA: >50 WBC/hpf (ref 0–5)

## 2022-07-26 LAB — COMPREHENSIVE METABOLIC PANEL
ALT: 14 U/L (ref 0–44)
AST: 14 U/L — ABNORMAL LOW (ref 15–41)
Albumin: 3.5 g/dL (ref 3.5–5.0)
Alkaline Phosphatase: 51 U/L (ref 38–126)
Anion gap: 6 (ref 5–15)
BUN: 11 mg/dL (ref 6–20)
CO2: 27 mmol/L (ref 22–32)
Calcium: 8.7 mg/dL — ABNORMAL LOW (ref 8.9–10.3)
Chloride: 104 mmol/L (ref 98–111)
Creatinine, Ser: 0.73 mg/dL (ref 0.44–1.00)
GFR, Estimated: >60 mL/min (ref >60)
Glucose, Bld: 101 mg/dL — ABNORMAL HIGH (ref 70–99)
Potassium: 3.5 mmol/L (ref 3.5–5.1)
Sodium: 137 mmol/L (ref 135–145)
Total Bilirubin: 0.5 mg/dL (ref 0.3–1.2)
Total Protein: 7 g/dL (ref 6.5–8.1)

## 2022-07-26 LAB — URINALYSIS, ROUTINE W REFLEX MICROSCOPIC
Bilirubin Urine: NEGATIVE
Bilirubin Urine: NEGATIVE
Glucose, UA: NEGATIVE mg/dL
Glucose, UA: NEGATIVE mg/dL
Hgb urine dipstick: NEGATIVE
Hgb urine dipstick: NEGATIVE
Ketones, ur: NEGATIVE mg/dL
Ketones, ur: NEGATIVE mg/dL
Nitrite: NEGATIVE
Nitrite: NEGATIVE
Protein, ur: NEGATIVE mg/dL
Protein, ur: NEGATIVE mg/dL
Specific Gravity, Urine: 1.02 (ref 1.005–1.030)
Specific Gravity, Urine: 1.015 (ref 1.005–1.030)
pH: 7 (ref 5.0–8.0)
pH: 8 (ref 5.0–8.0)

## 2022-07-26 LAB — LIPASE, BLOOD: Lipase: 33 U/L (ref 11–51)

## 2022-07-26 LAB — HCG, QUANTITATIVE, PREGNANCY: hCG, Beta Chain, Quant, S: <1 m[IU]/mL (ref <5)

## 2022-07-26 LAB — LACTIC ACID, PLASMA: Lactic Acid, Venous: 1.5 mmol/L (ref 0.5–1.9)

## 2022-07-26 MED ORDER — METOPROLOL TARTRATE 5 MG/5ML IV SOLN: 5.0000 mg | Freq: Four times a day (QID) | INTRAVENOUS | Status: DC | PRN

## 2022-07-26 MED ORDER — ENOXAPARIN SODIUM 40 MG/0.4ML IJ SOSY: 40.0000 mg | PREFILLED_SYRINGE | INTRAMUSCULAR | Status: DC

## 2022-07-26 MED ORDER — ONDANSETRON 4 MG PO TBDP: 4.0000 mg | ORAL_TABLET | Freq: Four times a day (QID) | ORAL | Status: DC | PRN

## 2022-07-26 MED ORDER — OXYCODONE HCL 5 MG PO TABS
5.0000 mg | ORAL_TABLET | ORAL | Status: DC | PRN
  Administered 2022-07-26 – 2022-07-27 (×3): 10 mg via ORAL
  Filled 2022-07-26 (×3): qty 2

## 2022-07-26 MED ORDER — ACETAMINOPHEN 650 MG RE SUPP: 650.0000 mg | Freq: Four times a day (QID) | RECTAL | Status: DC | PRN

## 2022-07-26 MED ORDER — POLYETHYLENE GLYCOL 3350 17 G PO PACK: 17.0000 g | PACK | Freq: Every day | ORAL | Status: DC | PRN

## 2022-07-26 MED ORDER — SODIUM CHLORIDE 0.9 % IV SOLN: 1.0000 g | Freq: Once | INTRAVENOUS | Status: DC

## 2022-07-26 MED ORDER — SODIUM CHLORIDE 0.9 % IV SOLN
2.0000 g | INTRAVENOUS | Status: DC
  Administered 2022-07-26: 2 g via INTRAVENOUS
  Filled 2022-07-26: qty 20

## 2022-07-26 MED ORDER — LACTATED RINGERS IV BOLUS
1000.0000 mL | Freq: Once | INTRAVENOUS | Status: AC
  Administered 2022-07-26: 1000 mL via INTRAVENOUS

## 2022-07-26 MED ORDER — ACETAMINOPHEN 325 MG PO TABS: 650.0000 mg | ORAL_TABLET | Freq: Four times a day (QID) | ORAL | Status: DC | PRN

## 2022-07-26 MED ORDER — ONDANSETRON HCL 4 MG/2ML IJ SOLN
4.0000 mg | Freq: Once | INTRAMUSCULAR | Status: AC
  Administered 2022-07-26: 4 mg via INTRAVENOUS
  Filled 2022-07-26: qty 2

## 2022-07-26 MED ORDER — ONDANSETRON HCL 4 MG/2ML IJ SOLN
4.0000 mg | Freq: Four times a day (QID) | INTRAMUSCULAR | Status: DC | PRN
  Filled 2022-07-26: qty 2

## 2022-07-26 MED ORDER — MORPHINE SULFATE (PF) 4 MG/ML IV SOLN
4.0000 mg | Freq: Once | INTRAVENOUS | Status: AC
  Administered 2022-07-26: 4 mg via INTRAVENOUS
  Filled 2022-07-26: qty 1

## 2022-07-26 MED ORDER — DIPHENHYDRAMINE HCL 25 MG PO CAPS: 25.0000 mg | ORAL_CAPSULE | Freq: Four times a day (QID) | ORAL | Status: DC | PRN

## 2022-07-26 MED ORDER — DIPHENHYDRAMINE HCL 50 MG/ML IJ SOLN: 25.0000 mg | Freq: Four times a day (QID) | INTRAMUSCULAR | Status: DC | PRN

## 2022-07-26 MED ORDER — LACTATED RINGERS IV SOLN: INTRAVENOUS | Status: DC

## 2022-07-26 MED ORDER — SIMETHICONE 80 MG PO CHEW
40.0000 mg | CHEWABLE_TABLET | Freq: Four times a day (QID) | ORAL | Status: DC | PRN
  Filled 2022-07-26: qty 1

## 2022-07-26 MED ORDER — HYDROMORPHONE HCL 1 MG/ML IJ SOLN: 1.0000 mg | INTRAMUSCULAR | Status: DC | PRN

## 2022-07-26 NOTE — ED Notes (Signed)
Client instructed to remain NPO until further notice
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
## 2022-07-26 NOTE — Discharge Instructions (Signed)
[facility] LAPAROSCOPIC SURGERY: POST OP INSTRUCTIONS Always review your discharge instruction sheet given to you by the facility where your surgery was performed. IF YOU HAVE DISABILITY OR FAMILY LEAVE FORMS, YOU MUST BRING THEM TO THE OFFICE FOR PROCESSING.   DO NOT GIVE THEM TO YOUR DOCTOR.  PAIN CONTROL  First take acetaminophen (Tylenol) AND/or ibuprofen (Advil) to control your pain after surgery.  Follow directions on package.  Taking acetaminophen (Tylenol) and/or ibuprofen (Advil) regularly after surgery will help to control your pain and lower the amount of prescription pain medication you may need.  You should not take more than 3,000 mg (3 grams) of acetaminophen (Tylenol) in 24 hours.  You should not take ibuprofen (Advil), aleve, motrin, naprosyn or other NSAIDS if you have a history of stomach ulcers or chronic kidney disease.  A prescription for pain medication may be given to you upon discharge.  Take your pain medication as prescribed, if you still have uncontrolled pain after taking acetaminophen (Tylenol) or ibuprofen (Advil). Use ice packs to help control pain. If you need a refill on your pain medication, please contact your pharmacy.  They will contact our office to request authorization. Prescriptions will not be filled after 5pm or on week-ends.  HOME MEDICATIONS Take your usually prescribed medications unless otherwise directed.  DIET You should follow a light diet the first few days after arrival home.  Be sure to include lots of fluids daily. Avoid fatty, fried foods.   CONSTIPATION It is common to experience some constipation after surgery and if you are taking pain medication.  Increasing fluid intake and taking a stool softener (such as Colace) will usually help or prevent this problem from occurring.  A mild laxative (Milk of Magnesia or Miralax) should be taken according to package instructions if there are no bowel movements after 48  hours.  WOUND/INCISION CARE Most patients will experience some swelling and bruising in the area of the incisions.  Ice packs will help.  Swelling and bruising can take several days to resolve.  Unless discharge instructions indicate otherwise, follow guidelines below  STERI-STRIPS - you may remove your outer bandages 48 hours after surgery, and you may shower at that time.  You have steri-strips (small skin tapes) in place directly over the incision.  These strips should be left on the skin for 7-10 days.   DERMABOND/SKIN GLUE - you may shower in 24 hours.  The glue will flake off over the next 2-3 weeks. Any sutures or staples will be removed at the office during your follow-up visit.  ACTIVITIES You may resume regular (light) daily activities beginning the next day--such as daily self-care, walking, climbing stairs--gradually increasing activities as tolerated.  You may have sexual intercourse when it is comfortable.  Refrain from any heavy lifting or straining until approved by your doctor. You may drive when you are no longer taking prescription pain medication, you can comfortably wear a seatbelt, and you can safely maneuver your car and apply brakes.  FOLLOW-UP You should see your doctor in the office for a follow-up appointment approximately 2-3 weeks after your surgery.  You should have been given your post-op/follow-up appointment when your surgery was scheduled.  If you did not receive a post-op/follow-up appointment, make sure that you call for this appointment within a day or two after you arrive home to insure a convenient appointment time.   WHEN TO CALL YOUR DOCTOR: Fever over 101.0 Inability to urinate Continued bleeding from incision.   Increased pain, redness, or drainage from the incision. Increasing abdominal pain  The clinic staff is available to answer your questions during regular business hours.  Please don't hesitate to call and ask to speak to one of the nurses for  clinical concerns.  If you have a medical emergency, go to the nearest emergency room or call 911.  A surgeon from [facility] is always on call at the hospital. [address], [address], [place], [place]  [address] ? [address] [address], [place], [place]   [address] [phone] ? [phone] ? FAX [phone] Web site: [email or web address]  

## 2022-07-26 NOTE — ED Notes (Signed)
Med list updated Allergy List updated NPO for liquids: 0600hrs today NPO for solids: 0630hrs today Actual Wt: 81.5kg

## 2022-07-26 NOTE — ED Triage Notes (Signed)
Per EMS:  pt had onset of abdominal pain last night at 2130.  Went to [facility] and she did not like how she was treated and left.  Pt admitted to EMS of meth use.

## 2022-07-26 NOTE — ED Triage Notes (Signed)
Onset of abd pain last PM, denies nausea vomiting and diarrhea. Denies fevers.

## 2022-07-26 NOTE — ED Notes (Signed)
Lying on left side, appears less restless, is more comfortable in this position, no further moaning/ crying at this time.
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
## 2022-07-26 NOTE — ED Notes (Signed)
Report given to RN Jason Fila of [facility], via phone. ETA 20 minutes.

## 2022-07-26 NOTE — ED Notes (Signed)
Phone Handoff Report provided to rec RN at [facility] [employer], [facility] at Bedside, final report and paperwork given to [employer]

## 2022-07-26 NOTE — ED Notes (Signed)
Repeat Urine Spec obtained and to the lab per ED MD orders

## 2022-07-26 NOTE — ED Notes (Signed)
Up to restroom , states feeling better, urine spec obtained for repeat exam per ED MD request

## 2022-07-27 ENCOUNTER — Observation Stay (HOSPITAL_BASED_OUTPATIENT_CLINIC_OR_DEPARTMENT_OTHER): Payer: Medicaid Other | Admitting: Anesthesiology

## 2022-07-27 ENCOUNTER — Inpatient Hospital Stay (HOSPITAL_COMMUNITY): Admission: RE | Admit: 2022-07-27 | Payer: Medicaid Other | Source: Home / Self Care | Admitting: General Surgery

## 2022-07-27 ENCOUNTER — Other Ambulatory Visit: Payer: Self-pay

## 2022-07-27 ENCOUNTER — Encounter (HOSPITAL_COMMUNITY): Payer: Self-pay

## 2022-07-27 ENCOUNTER — Observation Stay (HOSPITAL_COMMUNITY): Payer: Medicaid Other | Admitting: Anesthesiology

## 2022-07-27 ENCOUNTER — Encounter (HOSPITAL_COMMUNITY)
Admission: EM | Disposition: A | Discharge status: Home / Self Care | Payer: Self-pay | Source: Home / Self Care | Attending: Emergency Medicine

## 2022-07-27 DIAGNOSIS — K810 Acute cholecystitis: Secondary | ICD-10-CM

## 2022-07-27 HISTORY — PX: CHOLECYSTECTOMY: SHX55

## 2022-07-27 LAB — COMPREHENSIVE METABOLIC PANEL
ALT: 42 U/L (ref 0–44)
AST: 52 U/L — ABNORMAL HIGH (ref 15–41)
Albumin: 3.1 g/dL — ABNORMAL LOW (ref 3.5–5.0)
Alkaline Phosphatase: 47 U/L (ref 38–126)
Anion gap: 9 (ref 5–15)
BUN: 6 mg/dL (ref 6–20)
CO2: 24 mmol/L (ref 22–32)
Calcium: 8.5 mg/dL — ABNORMAL LOW (ref 8.9–10.3)
Chloride: 105 mmol/L (ref 98–111)
Creatinine, Ser: 0.66 mg/dL (ref 0.44–1.00)
GFR, Estimated: >60 mL/min (ref >60)
Glucose, Bld: 90 mg/dL (ref 70–99)
Potassium: 3.9 mmol/L (ref 3.5–5.1)
Sodium: 138 mmol/L (ref 135–145)
Total Bilirubin: 0.4 mg/dL (ref 0.3–1.2)
Total Protein: 6.3 g/dL — ABNORMAL LOW (ref 6.5–8.1)

## 2022-07-27 LAB — SURGICAL PCR SCREEN
MRSA, PCR: NEGATIVE
Staphylococcus aureus: NEGATIVE

## 2022-07-27 LAB — CBC
HCT: 35.9 % — ABNORMAL LOW (ref 36.0–46.0)
Hemoglobin: 11.8 g/dL — ABNORMAL LOW (ref 12.0–15.0)
MCH: 30.6 pg (ref 26.0–34.0)
MCHC: 32.9 g/dL (ref 30.0–36.0)
MCV: 93.2 fL (ref 80.0–100.0)
Platelets: 266 10*3/uL (ref 150–400)
RBC: 3.85 MIL/uL — ABNORMAL LOW (ref 3.87–5.11)
RDW: 13.3 % (ref 11.5–15.5)
WBC: 5.8 10*3/uL (ref 4.0–10.5)
nRBC: 0 % (ref 0.0–0.2)

## 2022-07-27 LAB — LACTIC ACID, PLASMA: Lactic Acid, Venous: 0.6 mmol/L (ref 0.5–1.9)

## 2022-07-27 LAB — HIV ANTIBODY (ROUTINE TESTING W REFLEX): HIV Screen 4th Generation wRfx: NONREACTIVE

## 2022-07-27 SURGERY — LAPAROSCOPIC CHOLECYSTECTOMY
Anesthesia: General

## 2022-07-27 MED ORDER — DEXMEDETOMIDINE HCL IN NACL 200 MCG/50ML IV SOLN
INTRAVENOUS | Status: DC | PRN
  Administered 2022-07-27: 20 ug via INTRAVENOUS

## 2022-07-27 MED ORDER — CHLORHEXIDINE GLUCONATE 0.12 % MT SOLN: 15.0000 mL | Freq: Once | OROMUCOSAL | Status: AC

## 2022-07-27 MED ORDER — LIDOCAINE 2% (20 MG/ML) 5 ML SYRINGE
INTRAMUSCULAR | Status: DC | PRN
  Administered 2022-07-27: 80 mg via INTRAVENOUS

## 2022-07-27 MED ORDER — FENTANYL CITRATE PF 50 MCG/ML IJ SOSY
PREFILLED_SYRINGE | INTRAMUSCULAR | Status: AC
  Administered 2022-07-27: 50 ug via INTRAVENOUS
  Filled 2022-07-27: qty 2

## 2022-07-27 MED ORDER — DEXAMETHASONE SODIUM PHOSPHATE 10 MG/ML IJ SOLN
INTRAMUSCULAR | Status: DC | PRN
  Administered 2022-07-27: 10 mg via INTRAVENOUS

## 2022-07-27 MED ORDER — ONDANSETRON HCL 4 MG/2ML IJ SOLN
INTRAMUSCULAR | Status: DC | PRN
  Administered 2022-07-27: 4 mg via INTRAVENOUS

## 2022-07-27 MED ORDER — PROPOFOL 10 MG/ML IV BOLUS
INTRAVENOUS | Status: AC
  Filled 2022-07-27: qty 20

## 2022-07-27 MED ORDER — FENTANYL CITRATE (PF) 250 MCG/5ML IJ SOLN
INTRAMUSCULAR | Status: AC
  Filled 2022-07-27: qty 5

## 2022-07-27 MED ORDER — BUPIVACAINE HCL 0.25 % IJ SOLN
INTRAMUSCULAR | Status: DC | PRN
  Administered 2022-07-27: 30 mL

## 2022-07-27 MED ORDER — IBUPROFEN 400 MG PO TABS: 800.0000 mg | ORAL_TABLET | Freq: Three times a day (TID) | ORAL | Status: DC | PRN

## 2022-07-27 MED ORDER — 0.9 % SODIUM CHLORIDE (POUR BTL) OPTIME
TOPICAL | Status: DC | PRN
  Administered 2022-07-27: 1000 mL

## 2022-07-27 MED ORDER — ORAL CARE MOUTH RINSE
15.0000 mL | Freq: Once | OROMUCOSAL | Status: AC
  Administered 2022-07-27 (×2): 15 mL via OROMUCOSAL

## 2022-07-27 MED ORDER — LACTATED RINGERS IR SOLN
Status: DC | PRN
  Administered 2022-07-27: 1000 mL

## 2022-07-27 MED ORDER — KETOROLAC TROMETHAMINE 30 MG/ML IJ SOLN: 30.0000 mg | Freq: Once | INTRAMUSCULAR | Status: AC

## 2022-07-27 MED ORDER — LACTATED RINGERS IV SOLN: INTRAVENOUS | Status: DC

## 2022-07-27 MED ORDER — ROCURONIUM BROMIDE 10 MG/ML (PF) SYRINGE
PREFILLED_SYRINGE | INTRAVENOUS | Status: DC | PRN
  Administered 2022-07-27: 60 mg via INTRAVENOUS

## 2022-07-27 MED ORDER — ACETAMINOPHEN 500 MG PO TABS: 1000.0000 mg | ORAL_TABLET | Freq: Three times a day (TID) | ORAL | Status: AC | PRN

## 2022-07-27 MED ORDER — SUGAMMADEX SODIUM 200 MG/2ML IV SOLN
INTRAVENOUS | Status: DC | PRN
  Administered 2022-07-27: 200 mg via INTRAVENOUS

## 2022-07-27 MED ORDER — FENTANYL CITRATE PF 50 MCG/ML IJ SOSY
25.0000 ug | PREFILLED_SYRINGE | INTRAMUSCULAR | Status: DC | PRN
  Administered 2022-07-27: 50 ug via INTRAVENOUS

## 2022-07-27 MED ORDER — MIDAZOLAM HCL 2 MG/2ML IJ SOLN
INTRAMUSCULAR | Status: AC
  Filled 2022-07-27: qty 2

## 2022-07-27 MED ORDER — MIDAZOLAM HCL 5 MG/5ML IJ SOLN
INTRAMUSCULAR | Status: DC | PRN
  Administered 2022-07-27: 2 mg via INTRAVENOUS

## 2022-07-27 MED ORDER — KETOROLAC TROMETHAMINE 30 MG/ML IJ SOLN
INTRAMUSCULAR | Status: AC
  Administered 2022-07-27: 30 mg via INTRAVENOUS
  Filled 2022-07-27: qty 1

## 2022-07-27 MED ORDER — PROPOFOL 10 MG/ML IV BOLUS
INTRAVENOUS | Status: DC | PRN
  Administered 2022-07-27: 160 mg via INTRAVENOUS

## 2022-07-27 MED ORDER — FENTANYL CITRATE (PF) 100 MCG/2ML IJ SOLN
INTRAMUSCULAR | Status: DC | PRN
  Administered 2022-07-27: 50 ug via INTRAVENOUS
  Administered 2022-07-27: 100 ug via INTRAVENOUS
  Administered 2022-07-27 (×3): 50 ug via INTRAVENOUS

## 2022-07-27 MED ORDER — BUPIVACAINE HCL (PF) 0.25 % IJ SOLN
INTRAMUSCULAR | Status: AC
  Filled 2022-07-27: qty 30

## 2022-07-27 MED ORDER — FENTANYL CITRATE (PF) 100 MCG/2ML IJ SOLN
INTRAMUSCULAR | Status: AC
  Filled 2022-07-27: qty 2

## 2022-07-27 MED ORDER — AMISULPRIDE (ANTIEMETIC) 5 MG/2ML IV SOLN: 10.0000 mg | Freq: Once | INTRAVENOUS | Status: DC | PRN

## 2022-07-27 MED ORDER — OXYCODONE HCL 5 MG PO TABS: 5.0000 mg | ORAL_TABLET | Freq: Four times a day (QID) | ORAL | 0 refills | Status: AC | PRN

## 2022-07-27 SURGICAL SUPPLY — 42 items
APPLIER CLIP 5 13 M/L LIGAMAX5 (MISCELLANEOUS) ×1
APPLIER CLIP ROT 10 11.4 M/L (STAPLE)
BAG COUNTER SPONGE SURGICOUNT (BAG) IMPLANT
BENZOIN TINCTURE PRP APPL 2/3 (GAUZE/BANDAGES/DRESSINGS) IMPLANT
BNDG ADH 1X3 SHEER STRL LF (GAUZE/BANDAGES/DRESSINGS) IMPLANT
CABLE HIGH FREQUENCY MONO STRZ (ELECTRODE) ×1 IMPLANT
CHLORAPREP W/TINT 26 (MISCELLANEOUS) ×1 IMPLANT
CLIP APPLIE 5 13 M/L LIGAMAX5 (MISCELLANEOUS) ×1 IMPLANT
CLIP APPLIE ROT 10 11.4 M/L (STAPLE) IMPLANT
CLIP LIGATING HEM O LOK PURPLE (MISCELLANEOUS) IMPLANT
CLIP LIGATING HEMO O LOK GREEN (MISCELLANEOUS) ×1 IMPLANT
COVER MAYO STAND XLG (MISCELLANEOUS) ×1 IMPLANT
COVER SURGICAL LIGHT HANDLE (MISCELLANEOUS) ×1 IMPLANT
DERMABOND ADVANCED .7 DNX12 (GAUZE/BANDAGES/DRESSINGS) IMPLANT
DRAIN CHANNEL 19F RND (DRAIN) IMPLANT
DRAPE C-ARM 42X120 X-RAY (DRAPES) IMPLANT
EVACUATOR SILICONE 100CC (DRAIN) IMPLANT
GLOVE BIOGEL PI IND STRL 7.0 (GLOVE) ×1 IMPLANT
GLOVE SURG SS PI 7.0 STRL IVOR (GLOVE) ×1 IMPLANT
GOWN STRL REUS W/ TWL LRG LVL3 (GOWN DISPOSABLE) ×1 IMPLANT
GOWN STRL REUS W/TWL LRG LVL3 (GOWN DISPOSABLE) ×1
GRASPER SUT TROCAR 14GX15 (MISCELLANEOUS) ×1 IMPLANT
IRRIG SUCT STRYKERFLOW 2 WTIP (MISCELLANEOUS) ×1
IRRIGATION SUCT STRKRFLW 2 WTP (MISCELLANEOUS) ×1 IMPLANT
KIT BASIN OR (CUSTOM PROCEDURE TRAY) ×1 IMPLANT
KIT TURNOVER KIT A (KITS) IMPLANT
POUCH RETRIEVAL ECOSAC 10 (ENDOMECHANICALS) ×1 IMPLANT
POUCH RETRIEVAL ECOSAC 10MM (ENDOMECHANICALS) ×1
SCISSORS LAP 5X35 DISP (ENDOMECHANICALS) ×1 IMPLANT
SET CHOLANGIOGRAPH MIX (MISCELLANEOUS) IMPLANT
SET TUBE SMOKE EVAC HIGH FLOW (TUBING) ×1 IMPLANT
SLEEVE Z-THREAD 5X100MM (TROCAR) ×2 IMPLANT
SPIKE FLUID TRANSFER (MISCELLANEOUS) ×1 IMPLANT
STRIP CLOSURE SKIN 1/2X4 (GAUZE/BANDAGES/DRESSINGS) IMPLANT
SUT ETHILON 2 0 PS N (SUTURE) IMPLANT
SUT MNCRL AB 4-0 PS2 18 (SUTURE) ×1 IMPLANT
SUT VICRYL 0 ENDOLOOP (SUTURE) IMPLANT
TOWEL OR 17X26 10 PK STRL BLUE (TOWEL DISPOSABLE) ×1 IMPLANT
TOWEL OR NON WOVEN STRL DISP B (DISPOSABLE) IMPLANT
TRAY LAPAROSCOPIC (CUSTOM PROCEDURE TRAY) ×1 IMPLANT
TROCAR ADV FIXATION 12X100MM (TROCAR) ×1 IMPLANT
TROCAR Z-THREAD OPTICAL 5X100M (TROCAR) ×1 IMPLANT
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
## 2022-07-27 NOTE — Progress Notes (Signed)
Discharge instructions discussed with patient, verbalized agreement and understanding 

## 2022-07-27 NOTE — Progress Notes (Signed)
Pre Procedure note for inpatients:   Allison Cisneros has been scheduled for Procedure(s): LAPAROSCOPIC CHOLECYSTECTOMY (N/A) today. The various methods of treatment have been discussed with the patient. After consideration of the risks, benefits and treatment options the patient has consented to the planned procedure.   The patient has been seen and labs reviewed. There are no changes in the patient's condition to prevent proceeding with the planned procedure today.  Recent labs:  Lab Results  Component Value Date   WBC 5.8 07/27/2022   HGB 11.8 (L) 07/27/2022   HCT 35.9 (L) 07/27/2022   PLT 266 07/27/2022   GLUCOSE 90 07/27/2022   ALT 42 07/27/2022   AST 52 (H) 07/27/2022   NA 138 07/27/2022   K 3.9 07/27/2022   CL 105 07/27/2022   CREATININE 0.66 07/27/2022   BUN 6 07/27/2022   CO2 24 07/27/2022    Mickeal Skinner, MD 07/27/2022 9:00 AM

## 2022-07-27 NOTE — Transfer of Care (Signed)
Immediate Anesthesia Transfer of Care Note  Patient: Allison Cisneros  Procedure(s) Performed: LAPAROSCOPIC CHOLECYSTECTOMY  Patient Location: PACU  Anesthesia Type:General  Level of Consciousness: sedated, patient cooperative, and responds to stimulation  Airway & Oxygen Therapy: Patient Spontanous Breathing and Patient connected to face mask oxygen  Post-op Assessment: Report given to RN and Post -op Vital signs reviewed and stable  Post vital signs: Reviewed and stable  Last Vitals:  Vitals Value Taken Time  BP 125/61 07/27/22 1042  Temp    Pulse 75 07/27/22 1044  Resp 10 07/27/22 1044  SpO2 100 % 07/27/22 1044  Vitals shown include unvalidated device data.  Last Pain:  Vitals:   07/27/22 0830  TempSrc: Oral  PainSc:       Patients Stated Pain Goal: 0 (26/33/35 4562)  Complications: No notable events documented.

## 2022-07-27 NOTE — Op Note (Signed)
PATIENT:  Allison Cisneros  32 y.o. female  PRE-OPERATIVE DIAGNOSIS:  ACUTE CHOLECYSTITIS  POST-OPERATIVE DIAGNOSIS:  ACUTE CHOLECYSTITIS  PROCEDURE:  Procedure(s): LAPAROSCOPIC CHOLECYSTECTOMY   SURGEON:  Santa Abdelrahman, Arta Bruce, MD   ASSISTANT: none  ANESTHESIA:   local and general  Indications for procedure: [name] is a 33 y.o. female with symptoms of Abdominal pain and Nausea and vomiting consistent with gallbladder disease, Confirmed by ultrasound.  Description of procedure: The patient was brought into the operative suite, placed supine. Anesthesia was administered with endotracheal tube. Patient was strapped in place and foot board was secured. All pressure points were offloaded by foam padding. The patient was prepped and draped in the usual sterile fashion.  A periumbilical incision was made and optical entry was used to enter the abdomen. 2 5 mm trocars were placed on in the right lateral space on in the right subcostal space. A 1mm trocar was placed in the subxiphoid space. Marcaine was infused to the subxiphoid space and lateral upper right abdomen in the transversus abdominis plane. Next the patient was placed in reverse trendelenberg. The gallbladder appearedacutely inflamed.   The gallbladder was retracted cephalad and lateral. The peritoneum was reflected off the infundibulum working lateral to medial. The cystic duct and cystic artery were identified and further dissection revealed a critical view. The cystic duct and cystic artery were doubly clipped and ligated.   The gallbladder was removed off the liver bed with cautery. The Gallbladder was placed in a specimen bag. The gallbladder fossa was irrigated and hemostasis was applied with cautery. The gallbladder was removed via the 12mm trocar. The fascial defect was closed with interrupted 0 vicryl suture via laparoscopic trans-fascial suture passer. Pneumoperitoneum was removed, all trocar were removed. All  incisions were closed with 4-0 monocryl subcuticular stitch. The patient woke from anesthesia and was brought to PACU in stable condition. All counts were correct  Findings: acute cholecystitis  Specimen: gallbladder  Blood loss: 20 ml  Local anesthesia: 30 ml Marcaine  Complications: none  PLAN OF CARE: Admit for overnight observation  PATIENT DISPOSITION:  PACU - hemodynamically stable.  [facility], [place]

## 2022-07-27 NOTE — Anesthesia Preprocedure Evaluation (Signed)
Anesthesia Evaluation  Patient identified by MRN, date of birth, ID band Patient awake    Reviewed: Allergy & Precautions, NPO status , Patient's Chart, lab work & pertinent test results  Airway Mallampati: II  TM Distance: >3 FB Neck ROM: Full    Dental  (+) Dental Advisory Given   Pulmonary Current Smoker   breath sounds clear to auscultation       Cardiovascular negative cardio ROS  Rhythm:Regular Rate:Normal     Neuro/Psych  Headaches    GI/Hepatic negative GI ROS, Neg liver ROS,,,  Endo/Other  negative endocrine ROS    Renal/GU negative Renal ROS     Musculoskeletal   Abdominal   Peds  Hematology negative hematology ROS (+)   Anesthesia Other Findings   Reproductive/Obstetrics                             Anesthesia Physical Anesthesia Plan  ASA: 2  Anesthesia Plan: General   Post-op Pain Management: Tylenol PO (pre-op)* and Toradol IV (intra-op)*   Induction: Intravenous  PONV Risk Score and Plan: 2 and Dexamethasone, Ondansetron and Treatment may vary due to age or medical condition  Airway Management Planned: Oral ETT  Additional Equipment:   Intra-op Plan:   Post-operative Plan: Extubation in OR  Informed Consent: I have reviewed the patients History and Physical, chart, labs and discussed the procedure including the risks, benefits and alternatives for the proposed anesthesia with the patient or authorized representative who has indicated his/her understanding and acceptance.     Dental advisory given  Plan Discussed with:   Anesthesia Plan Comments:        Anesthesia Quick Evaluation

## 2022-07-27 NOTE — Anesthesia Procedure Notes (Signed)
Procedure Name: Intubation Date/Time: 07/27/2022 9:32 AM  Performed by: Gean Maidens, CRNAPre-anesthesia Checklist: Patient identified, Emergency Drugs available, Suction available, Patient being monitored and Timeout performed Patient Re-evaluated:Patient Re-evaluated prior to induction Oxygen Delivery Method: Circle system utilized Preoxygenation: Pre-oxygenation with 100% oxygen Induction Type: IV induction Laryngoscope Size: Mac and 4 Grade View: Grade I Tube type: Oral Tube size: 7.0 mm Number of attempts: 1 Airway Equipment and Method: Stylet Placement Confirmation: ETT inserted through vocal cords under direct vision, positive ETCO2 and breath sounds checked- equal and bilateral Secured at: 21 cm Tube secured with: Tape Dental Injury: Teeth and Oropharynx as per pre-operative assessment
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
## 2022-07-29 ENCOUNTER — Encounter (HOSPITAL_COMMUNITY): Payer: Self-pay | Admitting: General Surgery

## 2022-07-30 LAB — SURGICAL PATHOLOGY

## 2022-07-30 NOTE — Anesthesia Postprocedure Evaluation (Signed)
Anesthesia Post Note  Patient: Allison Cisneros  Procedure(s) Performed: LAPAROSCOPIC CHOLECYSTECTOMY     Patient location during evaluation: PACU Anesthesia Type: General Level of consciousness: awake and alert Pain management: pain level controlled Vital Signs Assessment: post-procedure vital signs reviewed and stable Respiratory status: spontaneous breathing, nonlabored ventilation, respiratory function stable and patient connected to nasal cannula oxygen Cardiovascular status: blood pressure returned to baseline and stable Postop Assessment: no apparent nausea or vomiting Anesthetic complications: no   No notable events documented.  Last Vitals:  Vitals:   07/27/22 1304 07/27/22 1408  BP: 116/75 114/75  Pulse: (!) 103 99  Resp:    Temp: 36.8 C 36.8 C  SpO2: 100% 100%    Last Pain:  Vitals:   07/27/22 1703  TempSrc:   PainSc: 8                  Tiajuana Amass

## 2022-08-17 ENCOUNTER — Ambulatory Visit: Payer: Medicaid Other | Admitting: Nurse Practitioner

## 2022-10-03 ENCOUNTER — Other Ambulatory Visit: Payer: Self-pay

## 2022-10-03 ENCOUNTER — Encounter (HOSPITAL_COMMUNITY): Payer: Self-pay

## 2022-10-03 ENCOUNTER — Emergency Department (HOSPITAL_COMMUNITY)
Admission: EM | Admit: 2022-10-03 | Discharge: 2022-10-03 | Discharge status: Against Medical Advice | Payer: Medicaid Other | Attending: Student | Admitting: Student

## 2022-10-03 DIAGNOSIS — R110 Nausea: Secondary | ICD-10-CM | POA: Diagnosis present

## 2022-10-03 DIAGNOSIS — Z5321 Procedure and treatment not carried out due to patient leaving prior to being seen by health care provider: Secondary | ICD-10-CM | POA: Insufficient documentation

## 2022-10-03 LAB — POC URINE PREG, ED: Preg Test, Ur: NEGATIVE

## 2022-10-03 NOTE — ED Notes (Signed)
Attempted to call pt to recheck vitals with no answer x2.

## 2022-10-03 NOTE — ED Provider Triage Note (Signed)
Emergency Medicine Provider Triage Evaluation Note  Allison Cisneros , a 32 y.o. female  was evaluated in triage.  Pt complains of nausea.  She pregnancy test at home which she says were positive, was negative came to ED for further evaluation..  Review of Systems  Per HPI  Physical Exam  BP (!) 130/98 (BP Location: Right Arm)   Pulse (!) 107   Temp 98.2 F (36.8 C) (Oral)   Resp 19   Ht 5\' 2"  (1.575 m)   Wt 77.1 kg   SpO2 100%   BMI 31.09 kg/m  Gen:   Awake, no distress   Resp:  Normal effort  MSK:   Moves extremities without difficulty  Other:  Mildly tachycardic, no abdominal tenderness  Medical Decision Making  Medically screening exam initiated at 6:44 PM.  Appropriate orders placed.  Allison Cisneros was informed that the remainder of the evaluation will be completed by another provider, this initial triage assessment does not replace that evaluation, and the importance of remaining in the ED until their evaluation is complete.     Theron Arista, PA-C 10/03/22 1845

## 2022-10-03 NOTE — ED Notes (Signed)
Attempted to call pt to recheck vital signs with no answer x3.

## 2022-10-03 NOTE — ED Triage Notes (Signed)
Pt came in via POV d/t nausea that started after she took her schizophrenia meds. Pt states that she has had 2 positive pregnancy tests recently & she feels the pregnancy may be causing the nausea after her meds. A/Ox4, denies pain while in triage.

## 2022-10-03 NOTE — ED Notes (Signed)
Attempted to call pt to recheck vital signs with no answer x1.

## 2022-11-20 ENCOUNTER — Ambulatory Visit (HOSPITAL_COMMUNITY): Payer: Medicaid Other | Admitting: Licensed Clinical Social Worker

## 2024-07-17 ENCOUNTER — Ambulatory Visit: Admitting: Family Medicine

## 2024-11-06 ENCOUNTER — Ambulatory Visit: Payer: Self-pay
